# Patient Record
Sex: Female | Born: 1995 | Race: Black or African American | Hispanic: No | Marital: Single | Smoking: Never smoker
Health system: Southern US, Community
[De-identification: ages and names within clinical notes are randomized; demographics above are authoritative.]

## PROBLEM LIST (undated history)

## (undated) DIAGNOSIS — J45990 Exercise induced bronchospasm: Secondary | ICD-10-CM

## (undated) HISTORY — DX: Exercise induced bronchospasm: J45.990

---
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
## 2019-10-06 ENCOUNTER — Emergency Department (HOSPITAL_COMMUNITY)
Admission: EM | Admit: 2019-10-06 | Discharge: 2019-10-06 | Disposition: A | Discharge status: Home / Self Care | Payer: Self-pay | Attending: Emergency Medicine | Admitting: Emergency Medicine

## 2019-10-06 ENCOUNTER — Other Ambulatory Visit: Payer: Self-pay

## 2019-10-06 ENCOUNTER — Emergency Department (HOSPITAL_COMMUNITY): Payer: Self-pay

## 2019-10-06 ENCOUNTER — Encounter (HOSPITAL_COMMUNITY): Payer: Self-pay

## 2019-10-06 DIAGNOSIS — O99342 Other mental disorders complicating pregnancy, second trimester: Secondary | ICD-10-CM | POA: Insufficient documentation

## 2019-10-06 DIAGNOSIS — O99322 Drug use complicating pregnancy, second trimester: Secondary | ICD-10-CM | POA: Insufficient documentation

## 2019-10-06 DIAGNOSIS — Z3A00 Weeks of gestation of pregnancy not specified: Secondary | ICD-10-CM | POA: Insufficient documentation

## 2019-10-06 DIAGNOSIS — F1910 Other psychoactive substance abuse, uncomplicated: Secondary | ICD-10-CM | POA: Insufficient documentation

## 2019-10-06 DIAGNOSIS — F458 Other somatoform disorders: Secondary | ICD-10-CM | POA: Insufficient documentation

## 2019-10-06 LAB — CBC WITH DIFFERENTIAL/PLATELET
Abs Immature Granulocytes: 0.01 10*3/uL (ref 0.00–0.07)
Basophils Absolute: 0 10*3/uL (ref 0.0–0.1)
Basophils Relative: 1 %
Eosinophils Absolute: 0.1 10*3/uL (ref 0.0–0.5)
Eosinophils Relative: 2 %
HCT: 40.8 % (ref 36.0–46.0)
Hemoglobin: 13 g/dL (ref 12.0–15.0)
Immature Granulocytes: 0 %
Lymphocytes Relative: 35 %
Lymphs Abs: 2.5 10*3/uL (ref 0.7–4.0)
MCH: 26.3 pg (ref 26.0–34.0)
MCHC: 31.9 g/dL (ref 30.0–36.0)
MCV: 82.6 fL (ref 80.0–100.0)
Monocytes Absolute: 0.7 10*3/uL (ref 0.1–1.0)
Monocytes Relative: 9 %
Neutro Abs: 3.7 10*3/uL (ref 1.7–7.7)
Neutrophils Relative %: 53 %
Platelets: 243 10*3/uL (ref 150–400)
RBC: 4.94 MIL/uL (ref 3.87–5.11)
RDW: 14.7 % (ref 11.5–15.5)
WBC: 7 10*3/uL (ref 4.0–10.5)
nRBC: 0 % (ref 0.0–0.2)

## 2019-10-06 LAB — ACETAMINOPHEN LEVEL: Acetaminophen (Tylenol), Serum: <10 ug/mL — ABNORMAL LOW (ref 10–30)

## 2019-10-06 LAB — WET PREP, GENITAL
Sperm: NONE SEEN
Trich, Wet Prep: NONE SEEN
Yeast Wet Prep HPF POC: NONE SEEN

## 2019-10-06 LAB — BASIC METABOLIC PANEL
Anion gap: 9 (ref 5–15)
BUN: 14 mg/dL (ref 6–20)
CO2: 23 mmol/L (ref 22–32)
Calcium: 9.3 mg/dL (ref 8.9–10.3)
Chloride: 106 mmol/L (ref 98–111)
Creatinine, Ser: 0.74 mg/dL (ref 0.44–1.00)
GFR calc Af Amer: >60 mL/min (ref >60)
GFR calc non Af Amer: >60 mL/min (ref >60)
Glucose, Bld: 105 mg/dL — ABNORMAL HIGH (ref 70–99)
Potassium: 3.7 mmol/L (ref 3.5–5.1)
Sodium: 138 mmol/L (ref 135–145)

## 2019-10-06 LAB — URINALYSIS, ROUTINE W REFLEX MICROSCOPIC
Bilirubin Urine: NEGATIVE
Glucose, UA: NEGATIVE mg/dL
Ketones, ur: 20 mg/dL — AB
Nitrite: NEGATIVE
Protein, ur: 100 mg/dL — AB
Specific Gravity, Urine: 1.03 (ref 1.005–1.030)
pH: 5 (ref 5.0–8.0)

## 2019-10-06 LAB — HCG, QUANTITATIVE, PREGNANCY: hCG, Beta Chain, Quant, S: <1 m[IU]/mL (ref <5)

## 2019-10-06 LAB — SALICYLATE LEVEL: Salicylate Lvl: <7 mg/dL (ref 2.8–30.0)

## 2019-10-06 LAB — RAPID URINE DRUG SCREEN, HOSP PERFORMED
Amphetamines: POSITIVE — AB
Barbiturates: NOT DETECTED
Benzodiazepines: NOT DETECTED
Cocaine: POSITIVE — AB
Opiates: NOT DETECTED
Tetrahydrocannabinol: POSITIVE — AB

## 2019-10-06 LAB — PREGNANCY, URINE: Preg Test, Ur: NEGATIVE

## 2019-10-06 LAB — ETHANOL: Alcohol, Ethyl (B): <10 mg/dL (ref <10)

## 2019-10-06 LAB — TSH: TSH: 0.688 u[IU]/mL (ref 0.350–4.500)

## 2019-10-06 LAB — AMMONIA: Ammonia: 29 umol/L (ref 9–35)

## 2019-10-06 LAB — ABO/RH: ABO/RH(D): O POS

## 2019-10-06 MED ORDER — METRONIDAZOLE 500 MG PO TABS
500.0000 mg | ORAL_TABLET | Freq: Once | ORAL | Status: AC
  Administered 2019-10-06: 14:00:00 500 mg via ORAL
  Filled 2019-10-06: qty 1

## 2019-10-06 NOTE — BH Assessment (Signed)
Assessment Note  Megan Monroe is an 23 y.o. female presenting voluntarily to [facility] ED complaining of abdominal pain. She also states she is 5 months pregnant. Patient is guarded upon TTS assessment. She tells assessor she came to the ED for an ultra sound. She just returned from ultra sound at time of assessment. Patient is guarded and provides limited history. She states this is not her first pregnancy but will not discuss whether or not she has children. Patient is also vague about current living situation. She states she is not homeless but will tell who she lives with or where. Patient was receptive to receiving information on housing. Patient denies SI/HI/AVH or any prior psychiatric treatment. She denies any substance use. Counselor informed her that her UDS is positive for THC, amphetamines, and cocaine. She remained quiet and did not provide explanation. Patient's pregnancy test was negative, however patient went for ultrasound. Patient asked counselor what was in her ultra-sound. Explained that I had not reviewed ultrasound but that would be an appropriate question for her RN or EDP. Patient states she does not need any assistance.  Patient is alert and and oriented x 4. She is dressed in a hospital gown, laying in bed. Her speech is logical, eye contact is fair, and her thoughts are organized. Her mood is guarded and her affect is congruent. Her insight, judgment and impulse control seem impaired. Patient does not appear to be responding to internal stimuli. It is unclear if pregnancy is a substance induce delusion or organic in nature.   Diagnosis: Amphetamine induced psychosis   R/o cocaine, amphetamine, and cannabis use disorder   R/o delusional disorder  Past Medical History:  Past Medical History:  Diagnosis Date  . Exercise-induced asthma     History reviewed. No pertinent surgical history.  Family History:  Family History  Problem Relation Age of Onset  . Hypertension Father    . Diabetes Maternal Grandmother   . Heart attack Cousin        Early 30s    Social History:  reports that she has never smoked. She has never used smokeless tobacco. She reports that she does not drink alcohol or use drugs.  Additional Social History:  Alcohol / Drug Use Pain Medications: see MAR Prescriptions: see MAR Over the Counter: see MAR Longest period of sobriety (when/how long): patient denies substance use despite positive UDS  CIWA: CIWA-Ar BP: 126/86 Pulse Rate: (!) 105 COWS:    Allergies: No Known Allergies  Home Medications: (Not in a hospital admission)   OB/GYN Status:  Patient's last menstrual period was 05/06/2019.  General Assessment Data Location of Assessment: [facility] ED TTS Assessment: In system Is this a Tele or Face-to-Face Assessment?: Face-to-Face Is this an Initial Assessment or a Re-assessment for this encounter?: Initial Assessment Patient Accompanied by:: N/A Language Other than English: No Living Arrangements: Homeless/Shelter(UTA) What gender do you identify as?: Female Marital status: Single Maiden name: Humann Pregnancy Status: No Living Arrangements: Alone Can pt return to current living arrangement?: Yes Admission Status: Voluntary Is patient capable of signing voluntary admission?: Yes Referral Source: Self/Family/Friend Insurance type: None     Crisis Care Plan Living Arrangements: Alone Legal Guardian: (self) Name of Psychiatrist: none Name of Therapist: none  Education Status Is patient currently in school?: No Is the patient employed, unemployed or receiving disability?: Unemployed  Risk to self with the past 6 months Suicidal Ideation: No Has patient been a risk to self within the past 6 months prior to  admission? : No Suicidal Intent: No Has patient had any suicidal intent within the past 6 months prior to admission? : No Is patient at risk for suicide?: No Suicidal Plan?: No Has patient had any suicidal plan  within the past 6 months prior to admission? : No Access to Means: No What has been your use of drugs/alcohol within the last 12 months?: THC, amphetamines, cocaine Previous Attempts/Gestures: No How many times?: 0 Other Self Harm Risks: none Triggers for Past Attempts: None known Intentional Self Injurious Behavior: None Family Suicide History: No Recent stressful life event(s): (UTA) Persecutory voices/beliefs?: No Depression: No Depression Symptoms: (none) Substance abuse history and/or treatment for substance abuse?: No Suicide prevention information given to non-admitted patients: Not applicable  Risk to Others within the past 6 months Homicidal Ideation: No Does patient have any lifetime risk of violence toward others beyond the six months prior to admission? : No Thoughts of Harm to Others: No Current Homicidal Intent: No Current Homicidal Plan: No Access to Homicidal Means: No Identified Victim: none History of harm to others?: No Assessment of Violence: None Noted Violent Behavior Description: none Does patient have access to weapons?: No Criminal Charges Pending?: No Does patient have a court date: No Is patient on probation?: No  Psychosis Hallucinations: None noted Delusions: None noted  Mental Status Report Appearance/Hygiene: Unremarkable Eye Contact: Fair Motor Activity: Freedom of movement Speech: Logical/coherent Level of Consciousness: Alert Mood: Apathetic Affect: Appropriate to circumstance Anxiety Level: None Thought Processes: Coherent, Relevant Judgement: Impaired Orientation: Person, Place, Time, Situation Obsessive Compulsive Thoughts/Behaviors: None  Cognitive Functioning Concentration: Normal Memory: Unable to Assess Is patient IDD: No Insight: Poor Impulse Control: Poor Appetite: Good Have you had any weight changes? : No Change Sleep: No Change Total Hours of Sleep: 8 Vegetative Symptoms: None  ADLScreening [facility] Assessment  Services) Patient's cognitive ability adequate to safely complete daily activities?: Yes Patient able to express need for assistance with ADLs?: Yes Independently performs ADLs?: Yes (appropriate for developmental age)  Prior Inpatient Therapy Prior Inpatient Therapy: No  Prior Outpatient Therapy Prior Outpatient Therapy: No Does patient have an ACCT team?: No Does patient have Intensive In-House Services?  : No Does patient have [facility] services? : No Does patient have P4CC services?: No  ADL Screening (condition at time of admission) Patient's cognitive ability adequate to safely complete daily activities?: Yes Is the patient deaf or have difficulty hearing?: No Does the patient have difficulty seeing, even when wearing glasses/contacts?: No Does the patient have difficulty concentrating, remembering, or making decisions?: No Patient able to express need for assistance with ADLs?: Yes Does the patient have difficulty dressing or bathing?: No Independently performs ADLs?: Yes (appropriate for developmental age) Does the patient have difficulty walking or climbing stairs?: No Weakness of Legs: None Weakness of Arms/Hands: None  Home Assistive Devices/Equipment Home Assistive Devices/Equipment: None  Therapy Consults (therapy consults require a physician order) PT Evaluation Needed: No OT Evalulation Needed: No SLP Evaluation Needed: No Abuse/Neglect Assessment (Assessment to be complete while patient is alone) Abuse/Neglect Assessment Can Be Completed: Yes Physical Abuse: Denies Verbal Abuse: Denies Sexual Abuse: Denies Exploitation of patient/patient's resources: Denies Self-Neglect: Denies Values / Beliefs Cultural Requests During Hospitalization: None Spiritual Requests During Hospitalization: None Consults Spiritual Care Consult Needed: No Transition of Care Team Consult Needed: No Advance Directives (For Healthcare) Does Patient Have a Medical Advance Directive?:  No Would patient like information on creating a medical advance directive?: No - Patient declined  Disposition: Per Sherron Flemingsashawn Dixon, PMHNP patient does not meet in patient criteria. Patient to be discharged with outpatient resources. Disposition Initial Assessment Completed for this Encounter: Yes  On Site Evaluation by:   Reviewed with Physician:    Celedonio MiyamotoMeredith  Razan Siler 10/06/2019 1:26 PM

## 2019-10-06 NOTE — ED Notes (Signed)
Pt having difficulty answering questions. Pt oriented however. Pt must be directed multiple times to answer questions.

## 2019-10-06 NOTE — ED Notes (Signed)
EDP at bedside  

## 2019-10-06 NOTE — ED Notes (Signed)
US tech at bedside

## 2019-10-06 NOTE — ED Notes (Signed)
Pt refused COVID swab. MD aware

## 2019-10-06 NOTE — ED Provider Notes (Signed)
[facility] Provider Note   CSN: [id number] Arrival date & time: 10/06/19  [id number]     History Chief Complaint  Patient presents with   Back Pain   5 months pregnant    Megan Monroe is a 23 y.o. female.  Level 5 caveat for possible psychiatric disorder.  Patient states she is here for ultrasound.  She believes she is about 5 months pregnant.  She states her last period was "5 months ago".  She has not seen a doctor about this pregnancy.  She has been told by multiple people that she should have an ultrasound.  States she developed some spotting today about 15 minutes prior to arrival and has had some abdominal cramping.  But no significant vaginal bleeding.  She denies any fevers, chills, nausea or vomiting.  Patient very difficult to gather history from and seems to be under the influence of some substance.  She requires questions to be asked multiple times before she can give a straight answer.  She believes this is her third pregnancy.  She states she does not have any children at home but denies ever having a miscarriage.  She denies any psychiatric history denies any alcohol or drug use.  She denies any pain with urination or hematuria.  No fever or vomiting.  No chest pain or shortness of breath.  The history is provided by the patient.  Back Pain Associated symptoms: abdominal pain   Associated symptoms: no chest pain, no dysuria, no fever, no headaches and no weakness        Past Medical History:  Diagnosis Date   Exercise-induced asthma     Patient Active Problem List   Diagnosis Date Noted   Posterior tibial tendinitis 08/20/2012   Reactive airway disease 10/15/2011    History reviewed. No pertinent surgical history.   OB History   No obstetric history on file.     Family History  Problem Relation Age of Onset   Hypertension Father    Diabetes Maternal Grandmother    Heart attack Cousin        Early 30s     Social History   Tobacco Use   Smoking status: Never Smoker   Smokeless tobacco: Never Used  Substance Use Topics   Alcohol use: No   Drug use: No    Home Medications Prior to Admission medications   Medication Sig Start Date End Date Taking? Authorizing Provider  albuterol (PROVENTIL) (2.5 MG/3ML) 0.083% nebulizer solution Take 3 mLs (2.5 mg total) by nebulization every 6 (six) hours as needed for wheezing or shortness of breath. Patient not taking: Reported on 10/06/2019 10/15/11 10/14/12  Edd Arbourrton, Jonathan, MD    Allergies    Patient has no known allergies.  Review of Systems   Review of Systems  Constitutional: Negative for activity change, appetite change and fever.  HENT: Negative for congestion and rhinorrhea.   Eyes: Negative for visual disturbance.  Respiratory: Negative for chest tightness.   Cardiovascular: Negative for chest pain.  Gastrointestinal: Positive for abdominal pain. Negative for nausea and vomiting.  Genitourinary: Positive for vaginal bleeding. Negative for dysuria and hematuria.  Musculoskeletal: Positive for back pain.  Skin: Negative for rash.  Neurological: Negative for dizziness, weakness and headaches.  Psychiatric/Behavioral: Negative for self-injury, sleep disturbance and suicidal ideas.   all other systems are negative except as noted in the HPI and PMH.    Physical Exam Updated Vital Signs BP 126/86 (BP Location: Left Arm)  Pulse (!) 105    Temp 98 F (36.7 C) (Oral)    Resp 16    Ht 5' (1.524 m)    Wt 56.7 kg    LMP 05/06/2019    SpO2 98%    BMI 24.41 kg/m   Physical Exam Vitals and nursing note reviewed.  Constitutional:      General: She is not in acute distress.    Appearance: She is well-developed.     Comments: Distracted, requires questions to be asked multiple times where she can give an answer.  She is however oriented to person, place and time.  She denies any suicidal or homicidal thoughts  HENT:     Head:  Normocephalic and atraumatic.     Mouth/Throat:     Pharynx: No oropharyngeal exudate.  Eyes:     Conjunctiva/sclera: Conjunctivae normal.     Pupils: Pupils are equal, round, and reactive to light.  Neck:     Comments: No meningismus. Cardiovascular:     Rate and Rhythm: Normal rate and regular rhythm.     Heart sounds: Normal heart sounds. No murmur.  Pulmonary:     Effort: Pulmonary effort is normal. No respiratory distress.     Breath sounds: Normal breath sounds.  Abdominal:     Palpations: Abdomen is soft.     Tenderness: There is no abdominal tenderness. There is no guarding or rebound.  Genitourinary:    Comments: Copywriter, advertising present.  Normal external genitalia.  There is dark blood in the vaginal vault with a small amount of bleeding from her cervical os which is closed. There is no cervical motion tenderness or lateralizing adnexal pain Musculoskeletal:        General: No tenderness. Normal range of motion.     Cervical back: Normal range of motion and neck supple.  Skin:    General: Skin is warm.  Neurological:     Mental Status: She is alert and oriented to person, place, and time.     Cranial Nerves: No cranial nerve deficit.     Motor: No abnormal muscle tone.     Coordination: Coordination normal.     Comments: No ataxia on finger to nose bilaterally. No pronator drift. 5/5 strength throughout. CN 2-12 intact.Equal grip strength. Sensation intact.   Psychiatric:        Behavior: Behavior normal.     ED Results / Procedures / Treatments   Labs (all labs ordered are listed, but only abnormal results are displayed) Labs Reviewed  WET PREP, GENITAL - Abnormal; Notable for the following components:      Result Value   Clue Cells Wet Prep HPF POC PRESENT (*)    WBC, Wet Prep HPF POC FEW (*)    All other components within normal limits  URINALYSIS, ROUTINE W REFLEX MICROSCOPIC - Abnormal; Notable for the following components:   Color, Urine AMBER (*)     APPearance HAZY (*)    Hgb urine dipstick LARGE (*)    Ketones, ur 20 (*)    Protein, ur 100 (*)    Leukocytes,Ua TRACE (*)    Bacteria, UA FEW (*)    All other components within normal limits  RAPID URINE DRUG SCREEN, HOSP PERFORMED - Abnormal; Notable for the following components:   Cocaine POSITIVE (*)    Amphetamines POSITIVE (*)    Tetrahydrocannabinol POSITIVE (*)    All other components within normal limits  BASIC METABOLIC PANEL - Abnormal; Notable for the following components:  Glucose, Bld 105 (*)    All other components within normal limits  ACETAMINOPHEN LEVEL - Abnormal; Notable for the following components:   Acetaminophen (Tylenol), Serum <10 (*)    All other components within normal limits  SARS CORONAVIRUS 2 (TAT 6-24 HRS)  CBC WITH DIFFERENTIAL/PLATELET  HCG, QUANTITATIVE, PREGNANCY  PREGNANCY, URINE  ETHANOL  SALICYLATE LEVEL  AMMONIA  TSH  ABO/RH  GC/CHLAMYDIA PROBE AMP ([place]) NOT AT [facility]    EKG None  Radiology US Pelvis Complete  Result Date: 10/06/2019 CLINICAL DATA:  Vaginal bleeding EXAM: TRANSABDOMINAL ULTRASOUND OF PELVIS DOPPLER ULTRASOUND OF OVARIES TECHNIQUE: Transabdominal ultrasound examination of the pelvis was performed including evaluation of the uterus, ovaries, adnexal regions, and pelvic cul-de-sac. Color and duplex Doppler ultrasound was utilized to evaluate blood flow to the ovaries. Endovaginal evaluation was not performed due to patient wishes. COMPARISON:  None. FINDINGS: Uterus Measurements: 6.8 x 3.6 x 4.4 cm = volume: 55 mL. Limited assessment without focal abnormality. Endovaginal exam not performed. Endometrium Thickness: 3 mm.  Limited assessment without focal abnormality Right ovary Measurements: 3.1 x 1.9 x 2.8 cm = volume: 8.3 mL. Normal appearance/no adnexal mass. Left ovary Measurements: 2.9 x 1.4 x 1.8 cm = volume: 3.7 mL. Normal appearance/no adnexal mass. Pulsed Doppler evaluation demonstrates normal low-resistance  arterial and venous waveforms in both ovaries. Other: Urinary bladder is under distended limiting the exam. IMPRESSION: 1. No signs of ovarian torsion or free fluid. 2. Limited assessment due to lack of endovaginal examination at the patient's request. In Electronically Signed   By: Zetta Bills M.D.   On: 10/06/2019 12:59   [place] Art/Ven Flow Abd Pelv Doppler  Result Date: 10/06/2019 CLINICAL DATA:  Vaginal bleeding EXAM: TRANSABDOMINAL ULTRASOUND OF PELVIS DOPPLER ULTRASOUND OF OVARIES TECHNIQUE: Transabdominal ultrasound examination of the pelvis was performed including evaluation of the uterus, ovaries, adnexal regions, and pelvic cul-de-sac. Color and duplex Doppler ultrasound was utilized to evaluate blood flow to the ovaries. Endovaginal evaluation was not performed due to patient wishes. COMPARISON:  None. FINDINGS: Uterus Measurements: 6.8 x 3.6 x 4.4 cm = volume: 55 mL. Limited assessment without focal abnormality. Endovaginal exam not performed. Endometrium Thickness: 3 mm.  Limited assessment without focal abnormality Right ovary Measurements: 3.1 x 1.9 x 2.8 cm = volume: 8.3 mL. Normal appearance/no adnexal mass. Left ovary Measurements: 2.9 x 1.4 x 1.8 cm = volume: 3.7 mL. Normal appearance/no adnexal mass. Pulsed Doppler evaluation demonstrates normal low-resistance arterial and venous waveforms in both ovaries. Other: Urinary bladder is under distended limiting the exam. IMPRESSION: 1. No signs of ovarian torsion or free fluid. 2. Limited assessment due to lack of endovaginal examination at the patient's request. In Electronically Signed   By: Zetta Bills M.D.   On: 10/06/2019 12:59    Procedures Procedures (including critical care time)  Medications Ordered in ED Medications - No data to display  ED Course  I have reviewed the triage vital signs and the nursing notes.  Pertinent labs & imaging results that were available during my care of the patient were reviewed by me and  considered in my medical decision making (see chart for details).    MDM Rules/Calculators/A&P                       Patient here with possibility of pregnancy.  States last menstrual cycle was 5 months ago.  She appears to be under the influence of some substance.  Abdomen is soft.  Vitals are stable.  hCG is negative.  Patient informed of same.  TTS will be consulted given her delusion of pregnancy, bizarre behavior, concern for underlying psychiatric illness  Drug screen is positive for cocaine, THC and amphetamines.  Patient seen by TTS. They feel that she has amphetamine induced psychosis. She does not meet inpatient criteria.   Patient eloped from the ED before results could be discussed or outpatient resources given. She did no receive her head CT or treatment for her bacterial vaginosis.  Final Clinical Impression(s) / ED Diagnoses Final diagnoses:  Delusion of pregnancy  Polysubstance abuse [facility])    Rx / DC Orders ED Discharge Orders    None       Naturi Alarid, Jeannett Senior, MD 10/06/19 1805

## 2019-10-06 NOTE — ED Notes (Signed)
[place] tech requesting to speak with MD. Pt is uncooperative and non compliant with [place] tech directions for trans vaginal [place]. Pt is upset that she cannot see the screen as the [place] tech is trying to complete US. MD made aware. Per MD: Cancel transvaginal

## 2019-10-06 NOTE — ED Triage Notes (Signed)
Patient c/o bilateral low back pain. Patient told Probation officer that aher abdomen was hurting when she came into triage. Then when questioned about this the patient states, "My belly does not hurt. I just like rubbing it."  Patient states she is 5 months pregnant ,but has not seen an OB/GYN.

## 2019-10-06 NOTE — ED Notes (Signed)
Pt came out of room and stated " I am going to head out" Pt asked to wait to consult with MD. Pt went back in room. This RN went to speak to MD and pt eloped from room.

## 2019-10-06 NOTE — ED Notes (Signed)
Pt trying obtain urine for the second time. The first time pt states "the cup wouldn't let me do it"

## 2019-10-07 ENCOUNTER — Emergency Department (HOSPITAL_COMMUNITY)
Admission: EM | Admit: 2019-10-07 | Discharge: 2019-10-07 | Disposition: A | Discharge status: Home / Self Care | Payer: Self-pay | Attending: Emergency Medicine | Admitting: Emergency Medicine

## 2019-10-07 ENCOUNTER — Other Ambulatory Visit: Payer: Self-pay

## 2019-10-07 ENCOUNTER — Encounter (HOSPITAL_COMMUNITY): Payer: Self-pay

## 2019-10-07 DIAGNOSIS — B9689 Other specified bacterial agents as the cause of diseases classified elsewhere: Secondary | ICD-10-CM | POA: Insufficient documentation

## 2019-10-07 DIAGNOSIS — N760 Acute vaginitis: Secondary | ICD-10-CM | POA: Insufficient documentation

## 2019-10-07 DIAGNOSIS — J45909 Unspecified asthma, uncomplicated: Secondary | ICD-10-CM | POA: Insufficient documentation

## 2019-10-07 DIAGNOSIS — F22 Delusional disorders: Secondary | ICD-10-CM | POA: Insufficient documentation

## 2019-10-07 DIAGNOSIS — F1910 Other psychoactive substance abuse, uncomplicated: Secondary | ICD-10-CM | POA: Insufficient documentation

## 2019-10-07 LAB — GC/CHLAMYDIA PROBE AMP (~~LOC~~) NOT AT ARMC
Chlamydia: NEGATIVE
Neisseria Gonorrhea: NEGATIVE

## 2019-10-07 MED ORDER — METRONIDAZOLE 500 MG PO TABS: 500.0000 mg | ORAL_TABLET | Freq: Two times a day (BID) | ORAL | 0 refills | Status: DC

## 2019-10-07 NOTE — ED Triage Notes (Signed)
Pt reports that she would like to check on her pregnancy. Pt was dx'd with delusion of pregnancy yesterday. Pt also reporting that Tupac Shakur is a patient here and she needs to find him. Denies SI/HI.

## 2019-10-07 NOTE — ED Provider Notes (Signed)
[facility]-EMERGENCY DEPT Provider Note: Megan DellJ. Lane Savior Himebaugh, MD, FACEP  CSN: [id number] MRN: [id number] ARRIVAL: 10/07/19 at 0223 ROOM: WA16/WA16   CHIEF COMPLAINT  Delusional  Level 5 caveat: Delusional HISTORY OF PRESENT ILLNESS  10/07/19 5:32 AM Megan Monroe is a 23 y.o. female who was seen here yesterday believing she was 5 months pregnant.  Pregnancy test and quantitative beta-hCG were negative for pregnancy.  She did have a pelvic exam which showed evidence of bacterial vaginosis.  She also tested positive for multiple drugs of abuse.  She denied having a psychiatric history.  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  She believes she is pregnant when she is not.  She also believes she is 6 foot tall which she has not.  She also believes that [employer] Shakur is a patient in this hospital as she needs to find him.  She is otherwise calm and collected.  She denies any homicidal ideation or suicidal ideation.  She denies any acute physical complaint.  Triage informs me that she only checked in this morning because she wanted to be with her friend, a homeless man who checked in for ankle pain.  We currently have a policy, due to Covid, prohibiting visitors from coming back with patients.   The patient eloped yesterday before getting treatment for BV.   Past Medical History:  Diagnosis Date  . Exercise-induced asthma     History reviewed. No pertinent surgical history.  Family History  Problem Relation Age of Onset  . Hypertension Father   . Diabetes Maternal Grandmother   . Heart attack Cousin        Early 30s    Social History   Tobacco Use  . Smoking status: Never Smoker  . Smokeless tobacco: Never Used  Substance Use Topics  . Alcohol use: No  . Drug use: No    Prior to Admission medications   Medication Sig Start Date End Date Taking? Authorizing Provider  metroNIDAZOLE (FLAGYL) 500 MG tablet Take 1 tablet (500 mg total) by  mouth 2 (two) times daily. One po bid x 7 days 10/07/19   Janelys Glassner, MD  albuterol (PROVENTIL) (2.5 MG/3ML) 0.083% nebulizer solution Take 3 mLs (2.5 mg total) by nebulization every 6 (six) hours as needed for wheezing or shortness of breath. Patient not taking: Reported on 10/06/2019 10/15/11 10/07/19  Edd Arbourrton, Jonathan, MD    Allergies Patient has no known allergies.   REVIEW OF SYSTEMS  Negative except as noted here or in the History of Present Illness.   PHYSICAL EXAMINATION  Initial Vital Signs Blood pressure 119/72, pulse 77, temperature 97.7 F (36.5 C), temperature source Oral, resp. rate 18, height 5\' 5"  (1.651 m), weight 56.7 kg, SpO2 99 %.  Examination General: Well-developed, well-nourished female in no acute distress; appearance consistent with age of record HENT: normocephalic; atraumatic Eyes: pupils equal, round and reactive to light; extraocular muscles intact Neck: supple Heart: regular rate and rhythm Lungs: clear to auscultation bilaterally Abdomen: soft; nondistended; nontender; bowel sounds present Extremities: No deformity; full range of motion Neurologic: Awake, alert; motor function intact in all extremities and symmetric; no facial droop Skin: Warm and dry Psychiatric: Delusions; no HI; no SI; affable and cooperative   RESULTS  Summary of this visit's results, reviewed and interpreted by myself:   EKG Interpretation  Date/Time:    Ventricular Rate:    PR Interval:    QRS Duration:   QT Interval:  QTC Calculation:   R Axis:     Text Interpretation:        Laboratory Studies: Results for orders placed or performed during the hospital encounter of 10/06/19 (from the past 24 hour(s))  Urinalysis, Routine w reflex microscopic     Status: Abnormal   Collection Time: 10/06/19  8:41 AM  Result Value Ref Range   Color, Urine AMBER (A) YELLOW   APPearance HAZY (A) CLEAR   Specific Gravity, Urine 1.030 1.005 - 1.030   pH 5.0 5.0 - 8.0    Glucose, UA NEGATIVE NEGATIVE mg/dL   Hgb urine dipstick LARGE (A) NEGATIVE   Bilirubin Urine NEGATIVE NEGATIVE   Ketones, ur 20 (A) NEGATIVE mg/dL   Protein, ur 161 (A) NEGATIVE mg/dL   Nitrite NEGATIVE NEGATIVE   Leukocytes,Ua TRACE (A) NEGATIVE   RBC / HPF 21-50 0 - 5 RBC/hpf   WBC, UA 6-10 0 - 5 WBC/hpf   Bacteria, UA FEW (A) NONE SEEN   Squamous Epithelial / LPF 11-20 0 - 5   Mucus PRESENT   Urine rapid drug screen (hosp performed)not at [facility]     Status: Abnormal   Collection Time: 10/06/19  8:41 AM  Result Value Ref Range   Opiates NONE DETECTED NONE DETECTED   Cocaine POSITIVE (A) NONE DETECTED   Benzodiazepines NONE DETECTED NONE DETECTED   Amphetamines POSITIVE (A) NONE DETECTED   Tetrahydrocannabinol POSITIVE (A) NONE DETECTED   Barbiturates NONE DETECTED NONE DETECTED  CBC with Differential     Status: None   Collection Time: 10/06/19  8:41 AM  Result Value Ref Range   WBC 7.0 4.0 - 10.5 K/uL   RBC 4.94 3.87 - 5.11 MIL/uL   Hemoglobin 13.0 12.0 - 15.0 g/dL   HCT 09.6 04.5 - 40.9 %   MCV 82.6 80.0 - 100.0 fL   MCH 26.3 26.0 - 34.0 pg   MCHC 31.9 30.0 - 36.0 g/dL   RDW 81.1 91.4 - 78.2 %   Platelets 243 150 - 400 K/uL   nRBC 0.0 0.0 - 0.2 %   Neutrophils Relative % 53 %   Neutro Abs 3.7 1.7 - 7.7 K/uL   Lymphocytes Relative 35 %   Lymphs Abs 2.5 0.7 - 4.0 K/uL   Monocytes Relative 9 %   Monocytes Absolute 0.7 0.1 - 1.0 K/uL   Eosinophils Relative 2 %   Eosinophils Absolute 0.1 0.0 - 0.5 K/uL   Basophils Relative 1 %   Basophils Absolute 0.0 0.0 - 0.1 K/uL   Immature Granulocytes 0 %   Abs Immature Granulocytes 0.01 0.00 - 0.07 K/uL  Basic metabolic panel     Status: Abnormal   Collection Time: 10/06/19  8:41 AM  Result Value Ref Range   Sodium 138 135 - 145 mmol/L   Potassium 3.7 3.5 - 5.1 mmol/L   Chloride 106 98 - 111 mmol/L   CO2 23 22 - 32 mmol/L   Glucose, Bld 105 (H) 70 - 99 mg/dL   BUN 14 6 - 20 mg/dL   Creatinine, Ser 9.56 0.44 - 1.00 mg/dL    Calcium 9.3 8.9 - 21.3 mg/dL   GFR calc non Af Amer >60 >60 mL/min   GFR calc Af Amer >60 >60 mL/min   Anion gap 9 5 - 15  ABO/Rh     Status: None   Collection Time: 10/06/19  8:41 AM  Result Value Ref Range   ABO/RH(D)      O POS Performed at [facility] [employer]  [place] [address], [place], [place] [address]   hCG, quantitative, pregnancy     Status: None   Collection Time: 10/06/19  8:41 AM  Result Value Ref Range   hCG, Beta Chain, Quant, S <1 <5 mIU/mL  Pregnancy, urine     Status: None   Collection Time: 10/06/19  8:41 AM  Result Value Ref Range   Preg Test, Ur NEGATIVE NEGATIVE  Ethanol     Status: None   Collection Time: 10/06/19  9:03 AM  Result Value Ref Range   Alcohol, Ethyl (B) <19 <14 mg/dL  Salicylate Level     Status: None   Collection Time: 10/06/19  9:03 AM  Result Value Ref Range   Salicylate Lvl <7.8 2.8 - 30.0 mg/dL  Acetaminophen Level     Status: Abnormal   Collection Time: 10/06/19  9:03 AM  Result Value Ref Range   Acetaminophen (Tylenol), Serum <10 (L) 10 - 30 ug/mL  Ammonia     Status: None   Collection Time: 10/06/19  9:03 AM  Result Value Ref Range   Ammonia 29 9 - 35 umol/L  TSH     Status: None   Collection Time: 10/06/19  9:03 AM  Result Value Ref Range   TSH 0.688 0.350 - 4.500 uIU/mL  Wet prep, genital     Status: Abnormal   Collection Time: 10/06/19 10:04 AM   Specimen: Cervix  Result Value Ref Range   Yeast Wet Prep HPF POC NONE SEEN NONE SEEN   Trich, Wet Prep NONE SEEN NONE SEEN   Clue Cells Wet Prep HPF POC PRESENT (A) NONE SEEN   WBC, Wet Prep HPF POC FEW (A) NONE SEEN   Sperm NONE SEEN    Imaging Studies: US Pelvis Complete  Result Date: 10/06/2019 CLINICAL DATA:  Vaginal bleeding EXAM: TRANSABDOMINAL ULTRASOUND OF PELVIS DOPPLER ULTRASOUND OF OVARIES TECHNIQUE: Transabdominal ultrasound examination of the pelvis was performed including evaluation of the uterus, ovaries, adnexal regions, and pelvic cul-de-sac. Color  and duplex Doppler ultrasound was utilized to evaluate blood flow to the ovaries. Endovaginal evaluation was not performed due to patient wishes. COMPARISON:  None. FINDINGS: Uterus Measurements: 6.8 x 3.6 x 4.4 cm = volume: 55 mL. Limited assessment without focal abnormality. Endovaginal exam not performed. Endometrium Thickness: 3 mm.  Limited assessment without focal abnormality Right ovary Measurements: 3.1 x 1.9 x 2.8 cm = volume: 8.3 mL. Normal appearance/no adnexal mass. Left ovary Measurements: 2.9 x 1.4 x 1.8 cm = volume: 3.7 mL. Normal appearance/no adnexal mass. Pulsed Doppler evaluation demonstrates normal low-resistance arterial and venous waveforms in both ovaries. Other: Urinary bladder is under distended limiting the exam. IMPRESSION: 1. No signs of ovarian torsion or free fluid. 2. Limited assessment due to lack of endovaginal examination at the patient's request. In Electronically Signed   By: Zetta Bills M.D.   On: 10/06/2019 12:59   [place] Art/Ven Flow Abd Pelv Doppler  Result Date: 10/06/2019 CLINICAL DATA:  Vaginal bleeding EXAM: TRANSABDOMINAL ULTRASOUND OF PELVIS DOPPLER ULTRASOUND OF OVARIES TECHNIQUE: Transabdominal ultrasound examination of the pelvis was performed including evaluation of the uterus, ovaries, adnexal regions, and pelvic cul-de-sac. Color and duplex Doppler ultrasound was utilized to evaluate blood flow to the ovaries. Endovaginal evaluation was not performed due to patient wishes. COMPARISON:  None. FINDINGS: Uterus Measurements: 6.8 x 3.6 x 4.4 cm = volume: 55 mL. Limited assessment without focal abnormality. Endovaginal exam not performed. Endometrium Thickness: 3 mm.  Limited assessment without focal  abnormality Right ovary Measurements: 3.1 x 1.9 x 2.8 cm = volume: 8.3 mL. Normal appearance/no adnexal mass. Left ovary Measurements: 2.9 x 1.4 x 1.8 cm = volume: 3.7 mL. Normal appearance/no adnexal mass. Pulsed Doppler evaluation demonstrates normal low-resistance  arterial and venous waveforms in both ovaries. Other: Urinary bladder is under distended limiting the exam. IMPRESSION: 1. No signs of ovarian torsion or free fluid. 2. Limited assessment due to lack of endovaginal examination at the patient's request. In Electronically Signed   By: Donzetta Kohut M.D.   On: 10/06/2019 12:59    ED COURSE and MDM  Nursing notes, initial and subsequent vitals signs, including pulse oximetry, reviewed and interpreted by myself.  Vitals:   10/07/19 0230  BP: 119/72  Pulse: 77  Resp: 18  Temp: 97.7 F (36.5 C)  TempSrc: Oral  SpO2: 99%  Weight: 56.7 kg  Height:  (1.651 m)   Medications - No data to display  There is no indication of acute physical illness or psychiatric illness requiring further work-up or inpatient treatment.  PROCEDURES  Procedures   ED DIAGNOSES     ICD-10-CM   1. Delusional disorder (HCC)  F22   2. Bacterial vaginosis  N76.0    B96.89   3. Polysubstance abuse (HCC)  F19.10        Janeice Stegall, Jonny Ruiz, MD 10/07/19 [phone]

## 2019-10-27 ENCOUNTER — Encounter (HOSPITAL_COMMUNITY): Payer: Self-pay

## 2019-10-27 ENCOUNTER — Emergency Department (HOSPITAL_COMMUNITY)
Admission: EM | Admit: 2019-10-27 | Discharge: 2019-10-29 | Disposition: A | Discharge status: Other Acute Inpatient Hospital | Payer: Self-pay | Attending: Emergency Medicine | Admitting: Emergency Medicine

## 2019-10-27 ENCOUNTER — Other Ambulatory Visit: Payer: Self-pay

## 2019-10-27 DIAGNOSIS — J45909 Unspecified asthma, uncomplicated: Secondary | ICD-10-CM | POA: Insufficient documentation

## 2019-10-27 DIAGNOSIS — Z20822 Contact with and (suspected) exposure to covid-19: Secondary | ICD-10-CM | POA: Insufficient documentation

## 2019-10-27 DIAGNOSIS — F29 Unspecified psychosis not due to a substance or known physiological condition: Secondary | ICD-10-CM

## 2019-10-27 DIAGNOSIS — R4182 Altered mental status, unspecified: Secondary | ICD-10-CM | POA: Insufficient documentation

## 2019-10-27 LAB — COMPREHENSIVE METABOLIC PANEL
ALT: 18 U/L (ref 0–44)
AST: 23 U/L (ref 15–41)
Albumin: 4.1 g/dL (ref 3.5–5.0)
Alkaline Phosphatase: 60 U/L (ref 38–126)
Anion gap: 10 (ref 5–15)
BUN: 14 mg/dL (ref 6–20)
CO2: 24 mmol/L (ref 22–32)
Calcium: 9.2 mg/dL (ref 8.9–10.3)
Chloride: 104 mmol/L (ref 98–111)
Creatinine, Ser: 0.78 mg/dL (ref 0.44–1.00)
GFR calc Af Amer: >60 mL/min (ref >60)
GFR calc non Af Amer: >60 mL/min (ref >60)
Glucose, Bld: 92 mg/dL (ref 70–99)
Potassium: 3.5 mmol/L (ref 3.5–5.1)
Sodium: 138 mmol/L (ref 135–145)
Total Bilirubin: 0.7 mg/dL (ref 0.3–1.2)
Total Protein: 7.2 g/dL (ref 6.5–8.1)

## 2019-10-27 LAB — SALICYLATE LEVEL: Salicylate Lvl: <7 mg/dL — ABNORMAL LOW (ref 7.0–30.0)

## 2019-10-27 LAB — RAPID URINE DRUG SCREEN, HOSP PERFORMED
Amphetamines: NOT DETECTED
Barbiturates: NOT DETECTED
Benzodiazepines: NOT DETECTED
Cocaine: NOT DETECTED
Opiates: NOT DETECTED
Tetrahydrocannabinol: POSITIVE — AB

## 2019-10-27 LAB — CBC
HCT: 41 % (ref 36.0–46.0)
Hemoglobin: 13.2 g/dL (ref 12.0–15.0)
MCH: 26.8 pg (ref 26.0–34.0)
MCHC: 32.2 g/dL (ref 30.0–36.0)
MCV: 83.3 fL (ref 80.0–100.0)
Platelets: 268 10*3/uL (ref 150–400)
RBC: 4.92 MIL/uL (ref 3.87–5.11)
RDW: 15.5 % (ref 11.5–15.5)
WBC: 7.5 10*3/uL (ref 4.0–10.5)
nRBC: 0 % (ref 0.0–0.2)

## 2019-10-27 LAB — ETHANOL: Alcohol, Ethyl (B): <10 mg/dL (ref <10)

## 2019-10-27 LAB — I-STAT BETA HCG BLOOD, ED (MC, WL, AP ONLY): I-stat hCG, quantitative: <5 m[IU]/mL (ref <5)

## 2019-10-27 LAB — POC SARS CORONAVIRUS 2 AG -  ED: SARS Coronavirus 2 Ag: NEGATIVE

## 2019-10-27 LAB — ACETAMINOPHEN LEVEL: Acetaminophen (Tylenol), Serum: <10 ug/mL — ABNORMAL LOW (ref 10–30)

## 2019-10-27 NOTE — ED Notes (Signed)
Pt politely informed that we need urine.  She took the urine cup and went to the bath room. Also, a "Nun's cap" was put on the toilet.

## 2019-10-27 NOTE — Progress Notes (Signed)
   10/27/19 0724  General Assessment Data  Reason for not completing assessment [facility] attempted to complete the [facility] Assessment.  Pt would not verify her name.  Pt stated "my name is Megan Monroe"  When  [facility] asked pt to verify her name again, pt refused.  TTS will attempt to assess pt at a later time.      Nikol Lemar L. Nye, [place], [facility], [facility] Therapeutic Triage Specialist  [phone]

## 2019-10-27 NOTE — ED Triage Notes (Signed)
On admission from triage to room 32, she states she doesn't know why she is here. Flippant and not invested in writers questions or answering them. Offered her food and she followed Probation officer into the snack room. When asked her to leave that I would bring it to her she left without further discussion. She used pleasant and appropriate manners with Probation officer once she was given food and drink but still wouldn't provide any information. Please see IVC paperwork.
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
## 2019-10-27 NOTE — ED Notes (Signed)
Opens eyes when spoken to but does not respond verbally and closes eyes again.  In bed resting.

## 2019-10-27 NOTE — BH Assessment (Addendum)
Tele Assessment Note   Patient Name: Megan Monroe MRN: [id number] Referring Physician: Merrily Pew, MD Location of Patient: [facility] Location of Provider: [place]  Nathan Stallworth is an 23 y.o. female. Pt presents to [facility] under IVC. Pt presented very polite but guarded during assessment. Pt responded to every single question, "Ummm, no" Pt was asked what brought her to the ED and she responded, "They told me I could leave". Pt denied SI, HI, AVH, SIB and substance use. Pt current UDS positive for marijuana. Pt reported having no provider and not taking any medications. Pt denied having any family support or collateral contacts.  When asked where she lives pt states, " nowhere close". Pt stated she is currently unemployed. Pt states she wants a phone to call her a ride so she can leave ED. Pt states she does not need any resources.   IVC paperwork:  She presents with paranoia and delusions. The patient states that there are several women that follow her from city to city trying to take her. She states it is the same women where she goes. She reports having multiple children but doesn't know where they are. It has been reported she has no children, Patient having some bizarrebehavior by glancing around the room and has poor eye contact.   Respondent wanted her friend to kill a man. She catches rides with random people. Lack of proper hygiene. Sleeping on the side of building. Not taking her meds. Missing from her family. She mentions the kidnapping of her child and she does not have a child."     Patient is alert and and oriented x 4. She is dressed in regular clothing , laying in bed. Her speech is logical, eye contact is fair, and her thoughts are organized. Her mood is guarded and her affect is congruent. Her insight, judgment and impulse control seem impaired. Patient does not appear to be responding to internal stimuli.   Diagnosis: Altered Mental  Status  Past Medical History:  Past Medical History:  Diagnosis Date  . Exercise-induced asthma     History reviewed. No pertinent surgical history.  Family History:  Family History  Problem Relation Age of Onset  . Hypertension Father   . Diabetes Maternal Grandmother   . Heart attack Cousin        Early 43s    Social History:  reports that she has never smoked. She has never used smokeless tobacco. She reports that she does not drink alcohol or use drugs.  Additional Social History:  Alcohol / Drug Use Pain Medications: see MAR Prescriptions: see MAR Over the Counter: see MAR  CIWA: CIWA-Ar BP: 131/78 Pulse Rate: 78 COWS:    Allergies: No Known Allergies  Home Medications: (Not in a hospital admission)   OB/GYN Status:  No LMP recorded.  General Assessment Data Assessment unable to be completed: Yes Reason for not completing assessment: [place] attempted to complete the [facility] Assessment.  Pt would not verify her name.  Pt stated "my name is Megan Monroe"  When  [facility] asked pt verify her name again, pt refused.  TTS will attempt to assess pt at a later time.   Location of Assessment: [facility] TTS Assessment: In system Is this a Tele or Face-to-Face Assessment?: Tele Assessment Is this an Initial Assessment or a Re-assessment for this encounter?: Initial Assessment Patient Accompanied by:: N/A Language Other than English: No Living Arrangements: Homeless/Shelter What gender do you identify as?: Female Marital status: Single Admission Status:  Voluntary Is patient capable of signing voluntary admission?: Yes Referral Source: Self/Family/Friend Insurance type: none     Crisis Care Plan Name of Psychiatrist: none Name of Therapist: none  Education Status Is patient currently in school?: No Is the patient employed, unemployed or receiving disability?: Unemployed  Risk to self with the past 6 months Suicidal Ideation: No Has patient been a risk to self within the past 6  months prior to admission? : No Suicidal Intent: No Has patient had any suicidal intent within the past 6 months prior to admission? : No Is patient at risk for suicide?: No Suicidal Plan?: No Has patient had any suicidal plan within the past 6 months prior to admission? : No Access to Means: (UTA) What has been your use of drugs/alcohol within the last 12 months?: pt denied Previous Attempts/Gestures: (UTA) How many times?: (UTA) Other Self Harm Risks: (UTA) Triggers for Past Attempts: None known Intentional Self Injurious Behavior: None Family Suicide History: No Recent stressful life event(s): (UTA) Persecutory voices/beliefs?: Rich Reining) Depression: (UTA) Depression Symptoms: (UTA) Substance abuse history and/or treatment for substance abuse?: No Suicide prevention information given to non-admitted patients: Not applicable  Risk to Others within the past 6 months Homicidal Ideation: No Does patient have any lifetime risk of violence toward others beyond the six months prior to admission? : No Thoughts of Harm to Others: No Current Homicidal Intent: No Current Homicidal Plan: No Access to Homicidal Means: No History of harm to others?: No Assessment of Violence: None Noted Does patient have access to weapons?: (UTA) Criminal Charges Pending?: No Does patient have a court date: No Is patient on probation?: No  Psychosis Hallucinations: None noted Delusions: None noted  Mental Status Report Appearance/Hygiene: Unremarkable Eye Contact: Good Motor Activity: Freedom of movement Speech: Logical/coherent Level of Consciousness: Alert Mood: Apathetic Affect: Appropriate to circumstance Anxiety Level: None Thought Processes: Coherent Judgement: Impaired Orientation: Not oriented Obsessive Compulsive Thoughts/Behaviors: None  Cognitive Functioning Concentration: Normal Memory: Unable to Assess Is patient IDD: No Insight: Poor Impulse Control: Poor Appetite: Good Have  you had any weight changes? : No Change Sleep: No Change Total Hours of Sleep: 8 Vegetative Symptoms: None  ADLScreening [facility] Assessment Services) Patient's cognitive ability adequate to safely complete daily activities?: Yes Patient able to express need for assistance with ADLs?: Yes Independently performs ADLs?: Yes (appropriate for developmental age)  Prior Inpatient Therapy Prior Inpatient Therapy: No  Prior Outpatient Therapy Prior Outpatient Therapy: No Does patient have an ACCT team?: No Does patient have Intensive In-House Services?  : No Does patient have [facility] services? : No Does patient have P4CC services?: No  ADL Screening (condition at time of admission) Patient's cognitive ability adequate to safely complete daily activities?: Yes Patient able to express need for assistance with ADLs?: Yes Independently performs ADLs?: Yes (appropriate for developmental age)                        Disposition: Per Dr Jola Babinski, pt meets inpatient criteria. Per [facility] Heather , TTS to fax out resources for bed for pt. TTS confirmed status with attending provider. Disposition Initial Assessment Completed for this Encounter: Yes  This service was provided via telemedicine using a 2-way, interactive audio and video technology.  Names of all persons participating in this telemedicine service and their role in this encounter. Name: Megan Monroe Role: Patient  Name: Lacey Jensen Role: TTS Counselor  Name:  Role:   Name:  Role:  Claria DiceKiara M Drevion Offord 10/27/2019 1:50 PM

## 2019-10-27 NOTE — ED Notes (Signed)
In to take her am vitals. Asked how old she was, responded "im grown" asked her how old grown was and she said "21 something" Minimal with information and borderline cooperation. She used the bathroom across the hall from her room. Reminded to use bathroom in her room, and minutes later again crossed the hall to use the hall bathroom.

## 2019-10-27 NOTE — Consult Note (Signed)
Tele Assessment.   Megan Monroe, 23 y.o., female patient presented to [facility].  Patient seen via telepsych by this provider; chart reviewed and consulted with Dr. Mallie Darting on 10/27/19.  On evaluation Megan Monroe demonstrates difficulty in rendering a cohesive story regarding events leading to her current inpatient hospitalization. She offers tangential responses to questions and appears preoccupied by something else in the room (although no one else is physically present).  She is paranoid and delusional, thinks 2 women are following her.  She relates she has children but she is unsure of their whereabouts. The patient also reports her mother as deceased.    The patient's mood is dysphoric but she cooperates with assessment.  Based on her altered mental status, lack of insight and impaired judgement, mental health dx and previous inpatient hospitalizations,  she poses an moderate to high safety risk.  Therefore, she continues to meet criteria for inpatient admission to [facility] hall.    Disposition: Recommend psychiatric Inpatient admission when medically cleared.   This service was provided via telemedicine using a 2-way, interactive audio and video technology.   Names of all persons participating in this telemedicine service and their role in this encounter. Name: Megan Monroe Role: Patient  Name: Merlyn Lot Role: [place]  Name: Darnelle Maffucci Money Role: PMHNP

## 2019-10-27 NOTE — ED Triage Notes (Addendum)
Pt IVC'd by her cousin. Papers state, " Respondent wanted her friend to kill a man. She catches rides with random people. Lack of proper hygiene. Sleeping on the side of building. Not taking her meds. Missing from her family. She mentions the kidnapping of her child and she does not have a child." Pt ambulatory and cooperative with triage.

## 2019-10-27 NOTE — ED Notes (Signed)
Vague to questions asked after she had a snack but states she has a place to live. Informed of process and that she is on an IVC. She states she hasnt had any change in her appetite or sleep.

## 2019-10-28 DIAGNOSIS — F29 Unspecified psychosis not due to a substance or known physiological condition: Secondary | ICD-10-CM

## 2019-10-28 NOTE — Progress Notes (Signed)
Received Megan Monroe this PM standing in the door way of her room.She requested her clothes to go home. The IVC process was explained to her in details, including she cannot leave at this time. She continued to be restless, but eventually calmed down and laid down in her bed. The sitter remains at the bedside. She slept throughout the night.

## 2019-10-28 NOTE — BH Assessment (Signed)
[facility] Assessment Progress Note  Per Berneice Heinrich, FNP, this pt requires psychiatric hospitalization at this time.  Pt presents under IVC initiated by pt's cousin, and upheld by Reola Calkins, NP.  The following facilities have been contacted to seek placement for this pt, with results as noted:  Beds available, information sent, decision pending: [facility] beds were available this morning)   Declined: [facility] (due to pt and unit acuity)   Doylene Canning, MA Behavioral Health Coordinator [phone]

## 2019-10-28 NOTE — Consult Note (Signed)
Telepsych Consultation   Reason for Consult: Involuntary committed Referring Physician: [facility], EDP Location of Patient: [facility] emergency department Location of Provider: [facility]  Patient Identification: Megan Monroe MRN:  [id number] Principal Diagnosis: Psychosis [facility]) Diagnosis:  Principal Problem:   Psychosis ([place])   Total Time spent with patient: 30 minutes  Subjective:   Megan Monroe is a 24 y.o. female patient admitted with paranoia and delusions.  Patient assessed by nurse practitioner.  Patient alert and oriented, answers appropriately at times, answers inappropriately at times.  Patient pleasant and cooperative.  Patient reports "I came to the emergency room to check on my pregnancy, I would like 6 or 7 months pregnant."  Patient pregnancy test results negative at this time.  Patient denies suicidal and homicidal ideations.  Patient denies history of self-harm, patient denies access to weapons.  Patient denies symptoms of paranoia.  Patient denies alcohol and substance use. Patient assessment completed via telemedicine.  Patient appears to be responding to internal stimuli at this time.  Patient looks toward corners of room, no one present in room.  Patient gestures with hands appears to make circular motion in air.  Case discussed with Dr. Mallie Darting. Inpatient treatment recommended at this time.  HPI: Patient involuntarily committed.  Patient appears delusional, patient appears to be responding to internal stimuli at this time.  Past Psychiatric History: Unknown  Risk to Self: Suicidal Ideation: No Suicidal Intent: No Is patient at risk for suicide?: No Suicidal Plan?: No Access to Means: (UTA) What has been your use of drugs/alcohol within the last 12 months?: pt denied How many times?: (UTA) Other Self Harm Risks: (UTA) Triggers for Past Attempts: None known Intentional Self Injurious Behavior: None Risk to Others: Homicidal Ideation:  No Thoughts of Harm to Others: No Current Homicidal Intent: No Current Homicidal Plan: No Access to Homicidal Means: No History of harm to others?: No Assessment of Violence: None Noted Does patient have access to weapons?: (UTA) Criminal Charges Pending?: No Does patient have a court date: No Prior Inpatient Therapy: Prior Inpatient Therapy: No Prior Outpatient Therapy: Prior Outpatient Therapy: No Does patient have an ACCT team?: No Does patient have Intensive In-House Services?  : No Does patient have [facility] services? : No Does patient have P4CC services?: No  Past Medical History:  Past Medical History:  Diagnosis Date  . Exercise-induced asthma    History reviewed. No pertinent surgical history. Family History:  Family History  Problem Relation Age of Onset  . Hypertension Father   . Diabetes Maternal Grandmother   . Heart attack Cousin        Early 63s   Family Psychiatric  History: Unknown Social History:  Social History   Substance and Sexual Activity  Alcohol Use No     Social History   Substance and Sexual Activity  Drug Use No    Social History   Socioeconomic History  . Marital status: Single    Spouse name: Not on file  . Number of children: Not on file  . Years of education: Not on file  . Highest education level: Not on file  Occupational History  . Not on file  Tobacco Use  . Smoking status: Never Smoker  . Smokeless tobacco: Never Used  Substance and Sexual Activity  . Alcohol use: No  . Drug use: No  . Sexual activity: Never  Other Topics Concern  . Not on file  Social History Narrative   Pt lives with mother, father (  or step father, this information was not clarified to me) and grandmother.   Social Determinants of Health   Financial Resource Strain:   . Difficulty of Paying Living Expenses: Not on file  Food Insecurity:   . Worried About Charity fundraiser in the Last Year: Not on file  . Ran Out of Food in the Last Year: Not  on file  Transportation Needs:   . Lack of Transportation (Medical): Not on file  . Lack of Transportation (Non-Medical): Not on file  Physical Activity:   . Days of Exercise per Week: Not on file  . Minutes of Exercise per Session: Not on file  Stress:   . Feeling of Stress : Not on file  Social Connections:   . Frequency of Communication with Friends and Family: Not on file  . Frequency of Social Gatherings with Friends and Family: Not on file  . Attends Religious Services: Not on file  . Active Member of Clubs or Organizations: Not on file  . Attends Archivist Meetings: Not on file  . Marital Status: Not on file   Additional Social History:    Allergies:  No Known Allergies  Labs:  Results for orders placed or performed during the hospital encounter of 10/27/19 (from the past 48 hour(s))  Comprehensive metabolic panel     Status: None   Collection Time: 10/27/19  3:00 AM  Result Value Ref Range   Sodium 138 135 - 145 mmol/L   Potassium 3.5 3.5 - 5.1 mmol/L   Chloride 104 98 - 111 mmol/L   CO2 24 22 - 32 mmol/L   Glucose, Bld 92 70 - 99 mg/dL   BUN 14 6 - 20 mg/dL   Creatinine, Ser 0.78 0.44 - 1.00 mg/dL   Calcium 9.2 8.9 - 10.3 mg/dL   Total Protein 7.2 6.5 - 8.1 g/dL   Albumin 4.1 3.5 - 5.0 g/dL   AST 23 15 - 41 U/L   ALT 18 0 - 44 U/L   Alkaline Phosphatase 60 38 - 126 U/L   Total Bilirubin 0.7 0.3 - 1.2 mg/dL   GFR calc non Af Amer >60 >60 mL/min   GFR calc Af Amer >60 >60 mL/min   Anion gap 10 5 - 15    Comment: Performed at [facility], [place] [address], [place], [place] [address]  Ethanol     Status: None   Collection Time: 10/27/19  3:00 AM  Result Value Ref Range   Alcohol, Ethyl (B) <10 <10 mg/dL    Comment: (NOTE) Lowest detectable limit for serum alcohol is 10 mg/dL. For medical purposes only. Performed at [facility], [place] [address], [place], [place] [address]   Salicylate level     Status: Abnormal    Collection Time: 10/27/19  3:00 AM  Result Value Ref Range   Salicylate Lvl <9.7 (L) 7.0 - 30.0 mg/dL    Comment: Performed at [facility], [place] [address] [place], [place] [address]  Acetaminophen level     Status: Abnormal   Collection Time: 10/27/19  3:00 AM  Result Value Ref Range   Acetaminophen (Tylenol), Serum <10 (L) 10 - 30 ug/mL    Comment: (NOTE) Therapeutic concentrations vary significantly. A range of 10-30 ug/mL  may be an effective concentration for many patients. However, some  are best treated at concentrations outside of this range. Acetaminophen concentrations >150 ug/mL at 4 hours after ingestion  and >50 ug/mL at  12 hours after ingestion are often associated with  toxic reactions. Performed at [facility], [place] [address] [place], [place] [address]   cbc     Status: None   Collection Time: 10/27/19  3:00 AM  Result Value Ref Range   WBC 7.5 4.0 - 10.5 K/uL   RBC 4.92 3.87 - 5.11 MIL/uL   Hemoglobin 13.2 12.0 - 15.0 g/dL   HCT 41.0 36.0 - 46.0 %   MCV 83.3 80.0 - 100.0 fL   MCH 26.8 26.0 - 34.0 pg   MCHC 32.2 30.0 - 36.0 g/dL   RDW 15.5 11.5 - 15.5 %   Platelets 268 150 - 400 K/uL   nRBC 0.0 0.0 - 0.2 %    Comment: Performed at [facility], [place] [address], [place], [place] [address]  I-Stat beta hCG blood, ED     Status: None   Collection Time: 10/27/19  3:05 AM  Result Value Ref Range   I-stat hCG, quantitative <5.0 <5 mIU/mL   Comment 3            Comment:   GEST. AGE      CONC.  (mIU/mL)   <=1 WEEK        5 - 50     2 WEEKS       50 - 500     3 WEEKS       100 - 10,000     4 WEEKS     1,000 - 30,000        FEMALE AND NON-PREGNANT FEMALE:     LESS THAN 5 mIU/mL   Rapid urine drug screen (hospital performed)     Status: Abnormal   Collection Time: 10/27/19 10:50 AM  Result Value Ref Range   Opiates NONE DETECTED NONE DETECTED   Cocaine NONE DETECTED NONE DETECTED   Benzodiazepines NONE  DETECTED NONE DETECTED   Amphetamines NONE DETECTED NONE DETECTED   Tetrahydrocannabinol POSITIVE (A) NONE DETECTED   Barbiturates NONE DETECTED NONE DETECTED    Comment: (NOTE) DRUG SCREEN FOR MEDICAL PURPOSES ONLY.  IF CONFIRMATION IS NEEDED FOR ANY PURPOSE, NOTIFY LAB WITHIN 5 DAYS. LOWEST DETECTABLE LIMITS FOR URINE DRUG SCREEN Drug Class                     Cutoff (ng/mL) Amphetamine and metabolites    1000 Barbiturate and metabolites    200 Benzodiazepine                 410 Tricyclics and metabolites     300 Opiates and metabolites        300 Cocaine and metabolites        300 THC                            50 Performed at [facility], [place] [address], [place], [place] [address]   POC SARS Coronavirus 2 Ag-ED - Nasal Swab (BD Veritor Kit)     Status: None   Collection Time: 10/27/19 12:47 PM  Result Value Ref Range   SARS Coronavirus 2 Ag NEGATIVE NEGATIVE    Comment: (NOTE) SARS-CoV-2 antigen NOT DETECTED.  Negative results are presumptive.  Negative results do not preclude SARS-CoV-2 infection and should not be used as the sole basis for treatment or other patient management decisions, including infection  control decisions, particularly in the presence of clinical signs and  symptoms  consistent with COVID-19, or in those who have been in contact with the virus.  Negative results must be combined with clinical observations, patient history, and epidemiological information. The expected result is Negative. Fact Sheet for Patients: PodPark.tn Fact Sheet for Healthcare Providers: GiftContent.is This test is not yet approved or cleared by the [place] FDA and  has been authorized for detection and/or diagnosis of SARS-CoV-2 by FDA under an Emergency Use Authorization (EUA).  This EUA will remain in effect (meaning this test can be used) for the duration of  the COVID-19 de claration under  Section 564(b)(1) of the Act, 21 U.S.C. section 360bbb-3(b)(1), unless the authorization is terminated or revoked sooner.     Medications:  No current facility-administered medications for this encounter.   Current Outpatient Medications  Medication Sig Dispense Refill  . metroNIDAZOLE (FLAGYL) 500 MG tablet Take 1 tablet (500 mg total) by mouth 2 (two) times daily. One po bid x 7 days (Patient not taking: Reported on 10/27/2019) 14 tablet 0    Musculoskeletal: Strength & Muscle Tone: within normal limits Gait & Station: normal Patient leans: N/A  Psychiatric Specialty Exam: Physical Exam  Nursing note and vitals reviewed. Constitutional: She is oriented to person, place, and time. She appears well-developed.  HENT:  Head: Normocephalic.  Cardiovascular: Normal rate.  Respiratory: Effort normal.  Neurological: She is alert and oriented to person, place, and time.  Psychiatric: She has a normal mood and affect. Her speech is normal and behavior is normal. Thought content is delusional. Cognition and memory are impaired. She expresses impulsivity.    Review of Systems  Constitutional: Negative.   HENT: Negative.   Eyes: Negative.   Respiratory: Negative.   Cardiovascular: Negative.   Gastrointestinal: Negative.   Genitourinary: Negative.   Musculoskeletal: Negative.   Skin: Negative.   Neurological: Negative.   Psychiatric/Behavioral: Positive for hallucinations.    Blood pressure (!) 100/56, pulse 76, temperature 99 F (37.2 C), temperature source Oral, resp. rate 18, height '5\' 2"'  (1.575 m), SpO2 99 %.Body mass index is 22.86 kg/m.  General Appearance: Casual  Eye Contact:  Fair  Speech:  Clear and Coherent and Normal Rate  Volume:  Normal  Mood:  Anxious  Affect:  Congruent  Thought Process:  Coherent, Goal Directed and Descriptions of Associations: Tangential  Orientation:  Full (Time, Place, and Person)  Thought Content:  Delusions  Suicidal Thoughts:  No   Homicidal Thoughts:  No  Memory:  Immediate;   Poor Recent;   Fair Remote;   Fair  Judgement:  Impaired  Insight:  Lacking  Psychomotor Activity:  Normal  Concentration:  Concentration: Fair and Attention Span: Fair  Recall:  [employer] of Knowledge:  Fair  Language:  Fair  Akathisia:  No  Handed:  Right  AIMS (if indicated):     Assets:  Communication Skills Desire for Improvement Financial Resources/Insurance Social Support  ADL's:  Intact  Cognition:  WNL  Sleep:        Treatment Plan Summary: Daily contact with patient to assess and evaluate symptoms and progress in treatment  Disposition: Recommend psychiatric Inpatient admission when medically cleared. Supportive therapy provided about ongoing stressors.  This service was provided via telemedicine using a 2-way, interactive audio and video technology.  Names of all persons participating in this telemedicine service and their role in this encounter. Name: Megan Monroe Role: Patient  Name: Letitia Libra Role: [place], [place] 10/28/2019 1:38 PM

## 2019-10-29 NOTE — ED Notes (Signed)
[employer] contacted to give patient ride to [employer].

## 2019-10-29 NOTE — ED Notes (Signed)
Report given to [facility] at [facility].

## 2019-10-29 NOTE — BH Assessment (Signed)
BHH Assessment Progress Note    Patient has been accepted to [facility] by Dr. Betti Cruz.  She can arrive to the [facility] Unit this morning.  Report will need to be called to [phone] prior to her discharge.

## 2019-10-29 NOTE — ED Notes (Signed)
[facility] returned call, stated they will be here around 0930 to get patient.

## 2020-01-08 ENCOUNTER — Emergency Department (HOSPITAL_COMMUNITY)
Admission: EM | Admit: 2020-01-08 | Discharge: 2020-01-08 | Disposition: A | Discharge status: Home / Self Care | Payer: Self-pay | Attending: Emergency Medicine | Admitting: Emergency Medicine

## 2020-01-08 ENCOUNTER — Other Ambulatory Visit: Payer: Self-pay

## 2020-01-08 DIAGNOSIS — Z760 Encounter for issue of repeat prescription: Secondary | ICD-10-CM | POA: Insufficient documentation

## 2020-01-08 NOTE — Discharge Instructions (Signed)
Refer to the attached information regarding behavioral health resources. Return to the emergency department for thoughts of self-harm, suicidal thoughts, thoughts of hurting others.

## 2020-01-08 NOTE — ED Triage Notes (Addendum)
Pt to Ed with c.o of needing to refill her behavioral health medication. Pt states she has been out for the past 3 weeks and has tried contacting her DR in [place] about it but has not been able to get it refilled. Patient cannot recall the name of the medicine but states was taking for depression.

## 2020-01-08 NOTE — ED Provider Notes (Signed)
[facility]-EMERGENCY DEPT Provider Note   CSN: [id number] Arrival date & time: 01/08/20  1615     History Chief Complaint  Patient presents with  . Medication Refill    Cherron Blitzer is a 24 y.o. female.  HPI      Maryln Eastham is a 24 y.o. female, with a history of asthma and psychosis, presenting to the ED requesting refills of her psychiatric medications.  She states she has not had them for at least 3 weeks. She does not remember the names of these medications. She states she was given written prescriptions for these medications from [facility] behavioral health clinic, gave them to a friend to get them filled at a pharmacy, but then the friend never filled them and told her they lost the prescription. She states, "I called the clinic and they said they could write me new prescriptions.  I was just trying to avoid driving to [place], but I can do that if I need to."  She came to the ED thinking we may have records from the clinic and be able to look up these medications. She denies SI/HI.  Denies A/V hallucinations.  Denies physical complaints.     Past Medical History:  Diagnosis Date  . Exercise-induced asthma     Patient Active Problem List   Diagnosis Date Noted  . Psychosis (HCC) 10/28/2019  . Posterior tibial tendinitis 08/20/2012  . Reactive airway disease 10/15/2011    No past surgical history on file.   OB History   No obstetric history on file.     Family History  Problem Relation Age of Onset  . Hypertension Father   . Diabetes Maternal Grandmother   . Heart attack Cousin        Early 30s    Social History   Tobacco Use  . Smoking status: Never Smoker  . Smokeless tobacco: Never Used  Substance Use Topics  . Alcohol use: No  . Drug use: No    Home Medications Prior to Admission medications   Medication Sig Start Date End Date Taking? Authorizing Provider  metroNIDAZOLE (FLAGYL) 500 MG tablet Take 1  tablet (500 mg total) by mouth 2 (two) times daily. One po bid x 7 days Patient not taking: Reported on 10/27/2019 10/07/19   Molpus, John, MD  albuterol (PROVENTIL) (2.5 MG/3ML) 0.083% nebulizer solution Take 3 mLs (2.5 mg total) by nebulization every 6 (six) hours as needed for wheezing or shortness of breath. Patient not taking: Reported on 10/06/2019 10/15/11 10/07/19  Edd Arbour, MD    Allergies    Patient has no known allergies.  Review of Systems   Review of Systems  Constitutional:       Medication refill  Respiratory: Negative for shortness of breath.   Cardiovascular: Negative for chest pain.  Gastrointestinal: Negative for nausea and vomiting.  Neurological: Negative for headaches.  Psychiatric/Behavioral: Negative for agitation, behavioral problems, confusion, dysphoric mood, hallucinations, sleep disturbance and suicidal ideas. The patient is not nervous/anxious.     Physical Exam Updated Vital Signs BP 125/72 (BP Location: Right Arm)   Pulse 89   Resp 16   SpO2 97%   Physical Exam Vitals and nursing note reviewed.  Constitutional:      General: She is not in acute distress.    Appearance: She is well-developed. She is not diaphoretic.  HENT:     Head: Normocephalic and atraumatic.  Eyes:     Conjunctiva/sclera: Conjunctivae normal.  Cardiovascular:  Rate and Rhythm: Normal rate and regular rhythm.  Pulmonary:     Effort: Pulmonary effort is normal.  Musculoskeletal:     Cervical back: Neck supple.  Skin:    General: Skin is warm and dry.     Coloration: Skin is not pale.  Neurological:     Mental Status: She is alert and oriented to person, place, and time.  Psychiatric:        Mood and Affect: Mood normal.        Behavior: Behavior normal.        Thought Content: Thought content normal.     Comments: I had a long conversation with the patient to try to establish her thought content and assure as much as possible that she did not have any  presentation consistent with acute psychosis. Her eye contact was normal.  Her conversational cadence was normal.  Her timeline of events was similar each time she recounted them.  There did not seem to be any bizarre thought content. She did not show signs of responding to internal stimuli.     ED Results / Procedures / Treatments   Labs (all labs ordered are listed, but only abnormal results are displayed) Labs Reviewed - No data to display  EKG None  Radiology No results found.  Procedures Procedures (including critical care time)  Medications Ordered in ED Medications - No data to display  ED Course  I have reviewed the triage vital signs and the nursing notes.  Pertinent labs & imaging results that were available during my care of the patient were reviewed by me and considered in my medical decision making (see chart for details).    MDM Rules/Calculators/A&P                      Patient presents to inquire about reissuance of prescriptions that were reportedly lost. I reviewed the patient's records that were available to me at the time of this visit, which were limited to the records within our [facility] health system.  There were no Care Everywhere records available. I was unable to find a record of any prescribed psychiatric medications for the patient. She does not have a presentation suggestive of acute psychiatric decompensation or psychosis. She seems to have a logical plan for obtaining her prescriptions. Return precautions were discussed.  Patient voices understanding of these instructions, accepts the plan, and is comfortable with discharge.     Final Clinical Impression(s) / ED Diagnoses Final diagnoses:  Medication refill    Rx / DC Orders ED Discharge Orders    None       Layla Maw 01/09/20 1625    Dorie Rank, MD 01/11/20 [phone]

## 2021-03-28 IMAGING — US US ART/VEN ABD/PELV/SCROTUM DOPPLER LTD
2 series · 13 of 25 positions shown · non-contrast
Comparison: None.

CLINICAL DATA: Vaginal bleeding

EXAM:
TRANSABDOMINAL ULTRASOUND OF PELVIS
DOPPLER ULTRASOUND OF OVARIES
TECHNIQUE: Transabdominal ultrasound examination of the pelvis was performed
including evaluation of the uterus, ovaries, adnexal regions, and
pelvic cul-de-sac.
Color and duplex Doppler ultrasound was utilized to evaluate blood
flow to the ovaries. Endovaginal evaluation was not performed due to
patient wishes.

[Series 1: us art/ven abd/pelv/scrotum doppler ltd · 12 of 51 slices shown (1 of 2)]
[im 1/51]
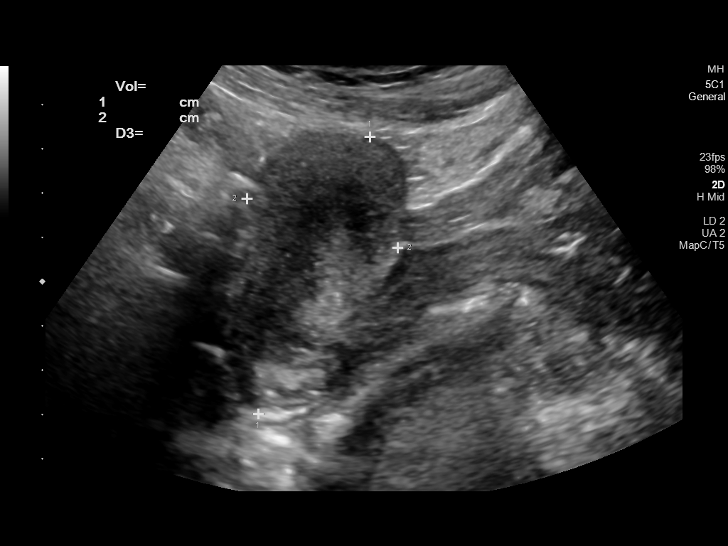
[im 5/51]
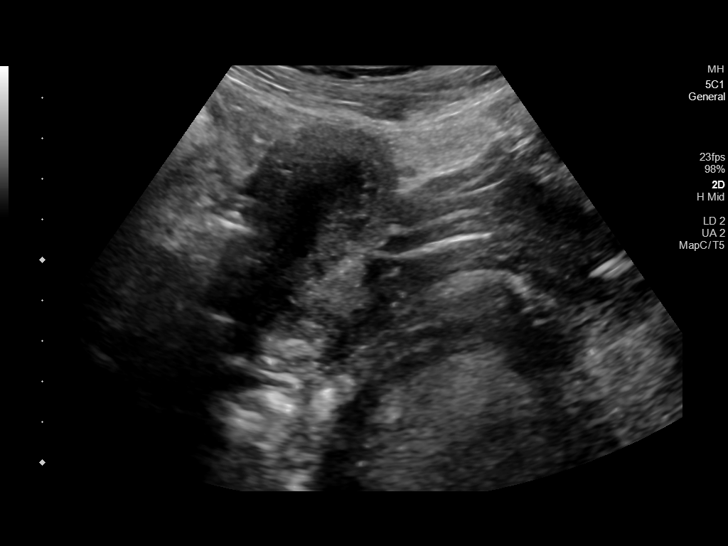
[im 9/51]
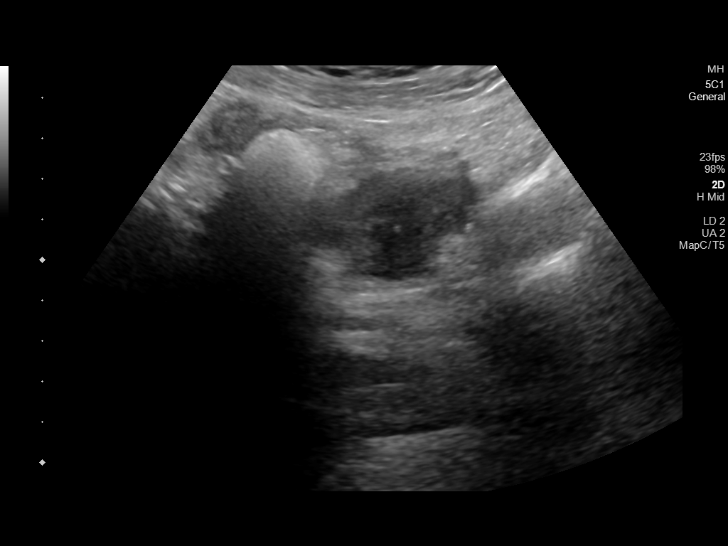
[im 14/51]
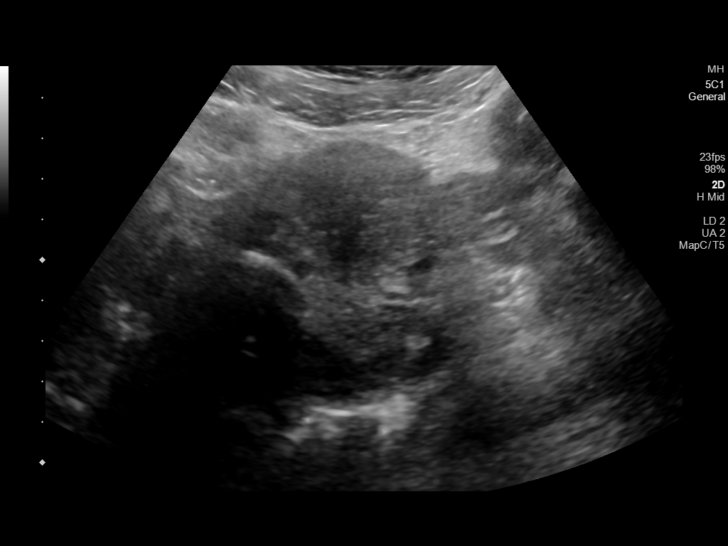
[im 18/51]
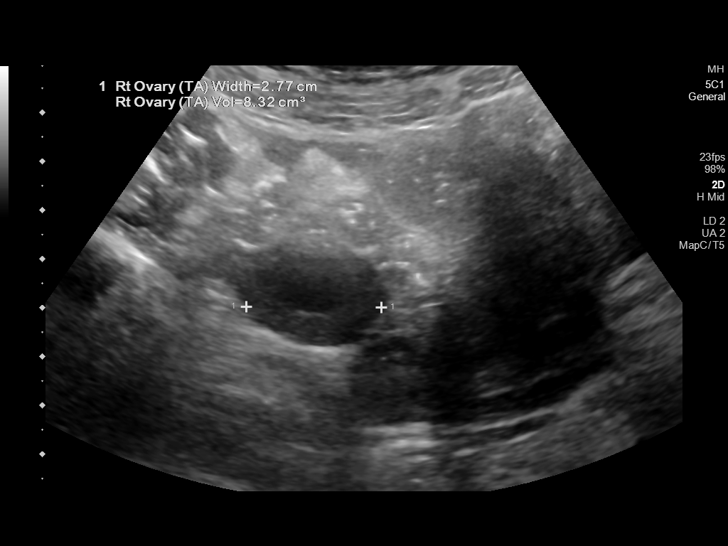
[im 22/51]
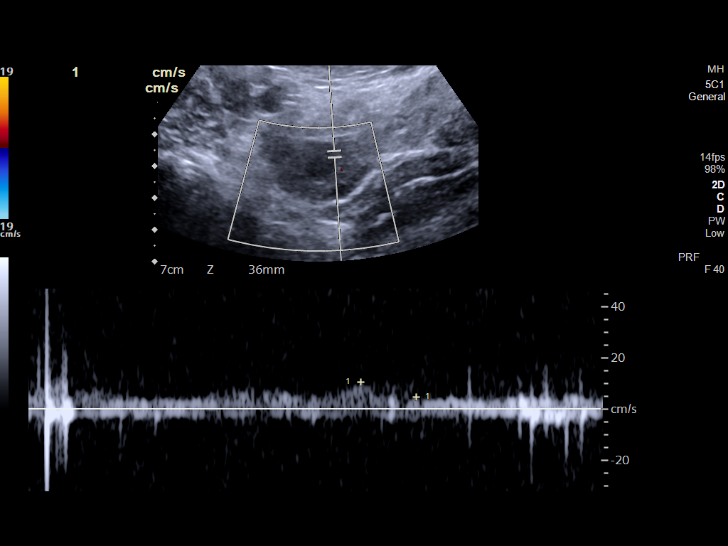
[im 27/51]
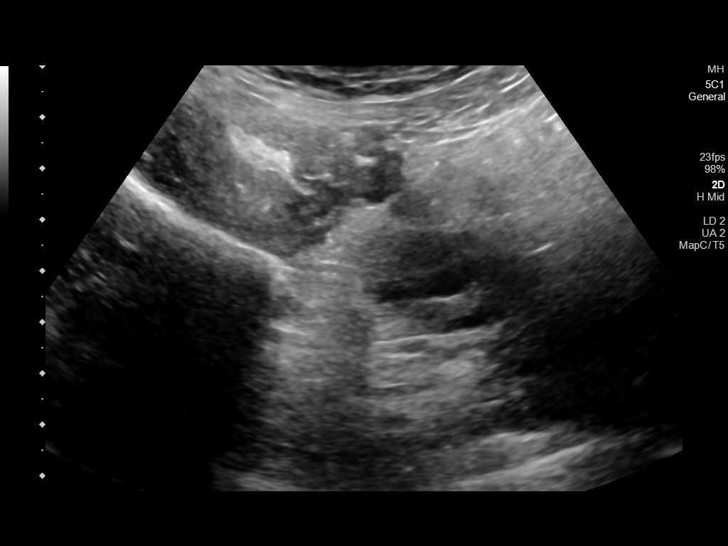
[im 31/51]
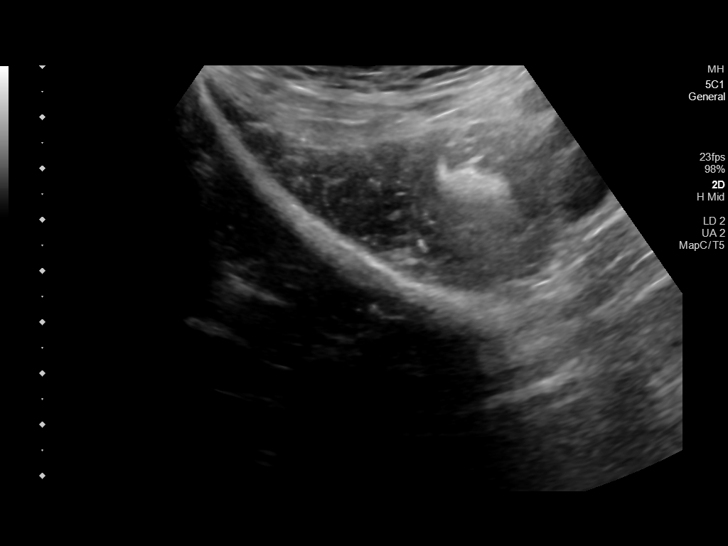
[im 35/51]
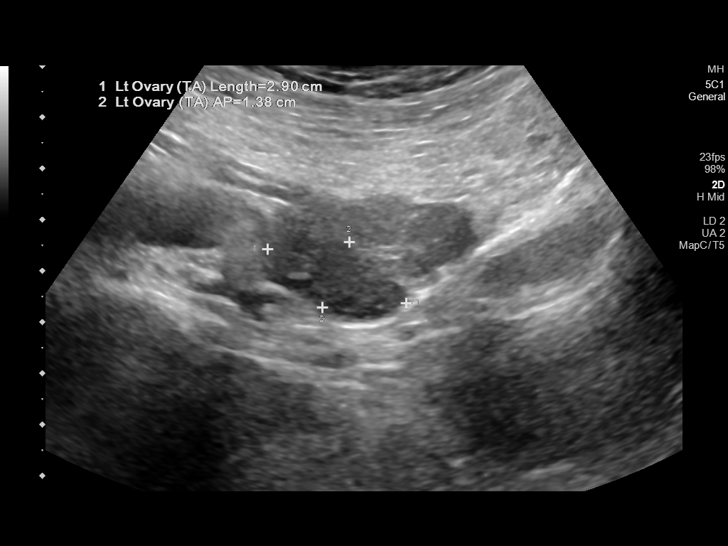
[im 40/51]
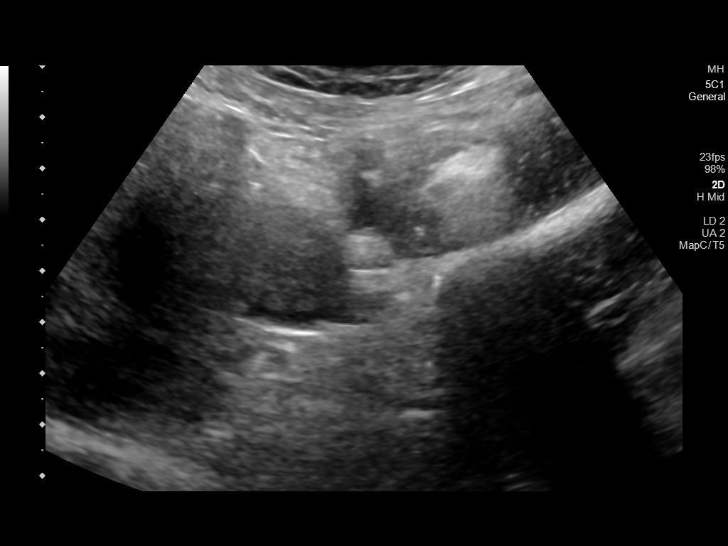
[im 44/51]
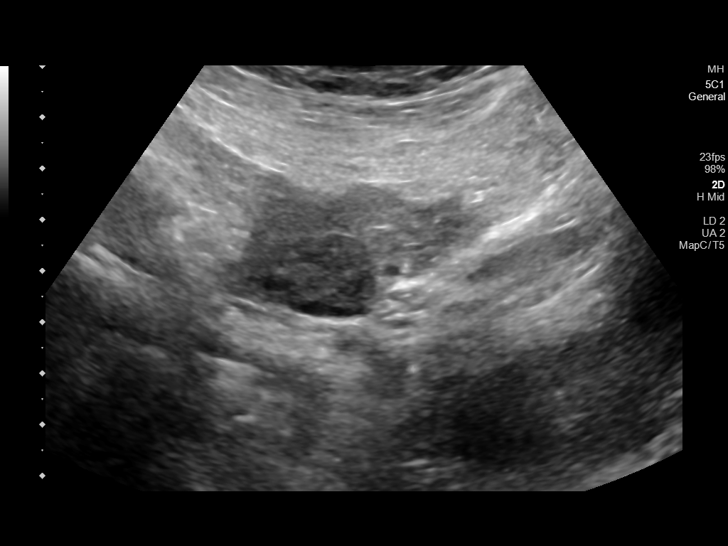
[im 48/51]
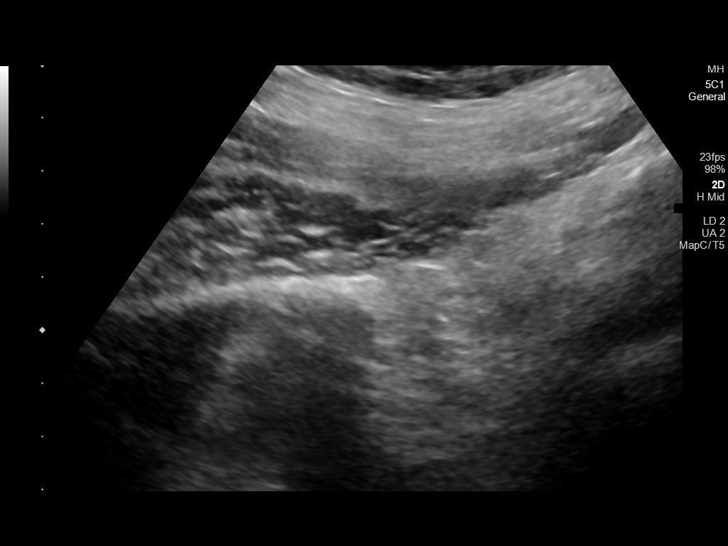

[Series 3: us art/ven abd/pelv/scrotum doppler ltd · 1 of 1 slices shown (2 of 2)]
[im 1/1]
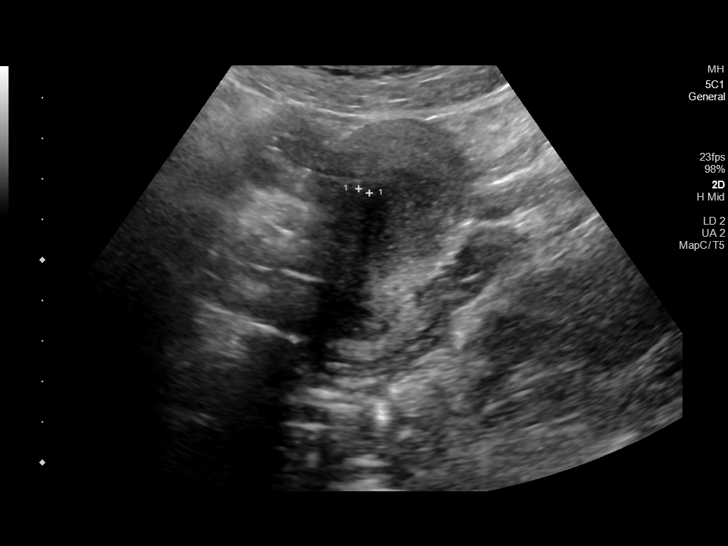

[13 of 25 positions shown; findings below may reference images not displayed]

FINDINGS: Uterus

Measurements: 6.8 x 3.6 x 4.4 cm = volume: 55 mL. Limited assessment
without focal abnormality. Endovaginal exam not performed.

Endometrium

Thickness: 3 mm.  Limited assessment without focal abnormality

Right ovary

Measurements: 3.1 x 1.9 x 2.8 cm = volume: 8.3 mL. Normal
appearance/no adnexal mass.

Left ovary

Measurements: 2.9 x 1.4 x 1.8 cm = volume: 3.7 mL. Normal
appearance/no adnexal mass.

Pulsed Doppler evaluation demonstrates normal low-resistance
arterial and venous waveforms in both ovaries.

Other: Urinary bladder is under distended limiting the exam.
IMPRESSION: 1. No signs of ovarian torsion or free fluid.
2. Limited assessment due to lack of endovaginal examination at the
patient's request. In

## 2021-03-28 IMAGING — US US PELVIS COMPLETE
2 series · 13 of 25 positions shown · non-contrast
Comparison: None.

CLINICAL DATA: Vaginal bleeding

EXAM:
TRANSABDOMINAL ULTRASOUND OF PELVIS
DOPPLER ULTRASOUND OF OVARIES
TECHNIQUE: Transabdominal ultrasound examination of the pelvis was performed
including evaluation of the uterus, ovaries, adnexal regions, and
pelvic cul-de-sac.
Color and duplex Doppler ultrasound was utilized to evaluate blood
flow to the ovaries. Endovaginal evaluation was not performed due to
patient wishes.

[Series 1: us pelvis complete · 12 of 51 slices shown (1 of 2)]
[im 1/51]
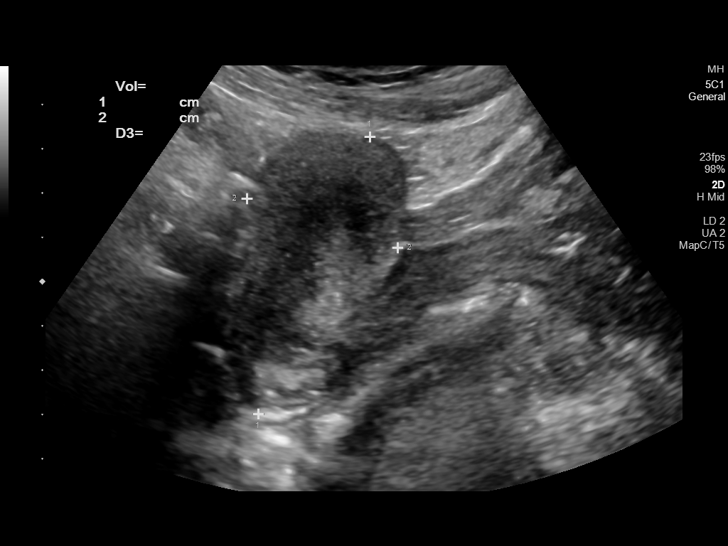
[im 5/51]
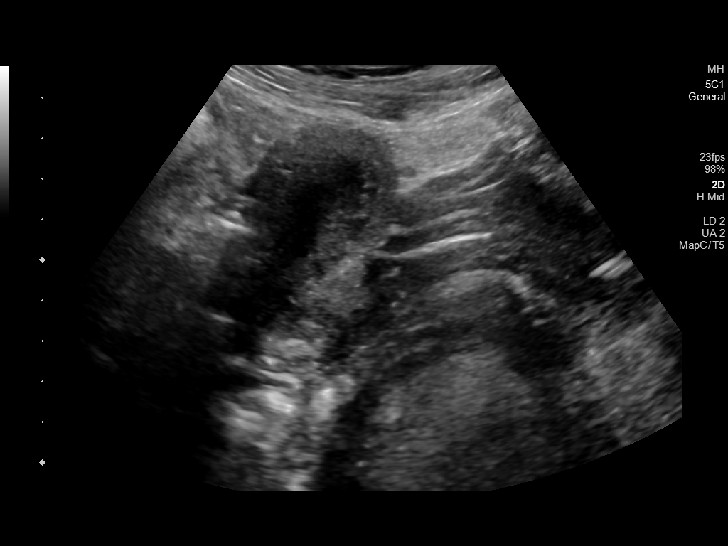
[im 9/51]
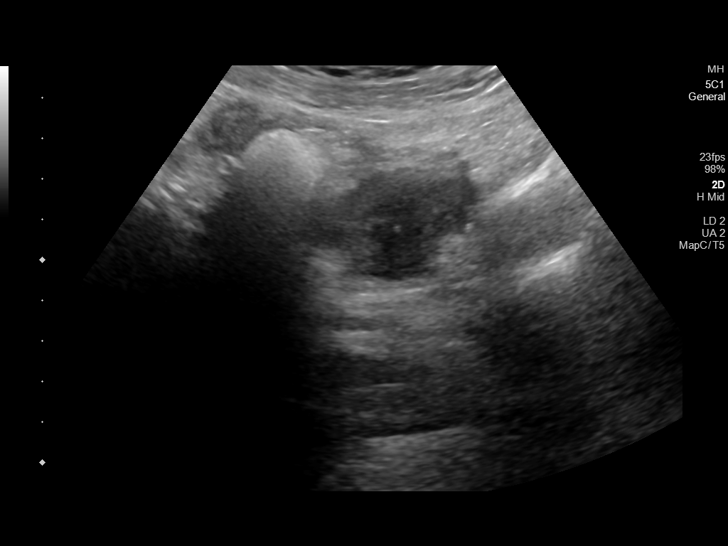
[im 14/51]
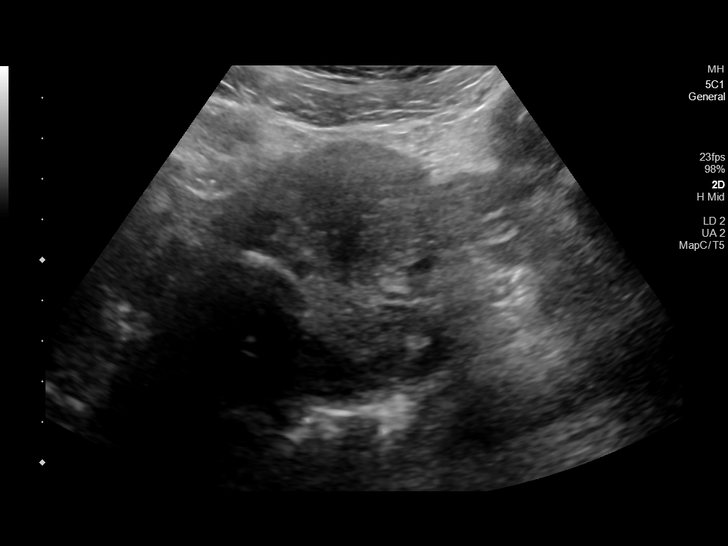
[im 18/51]
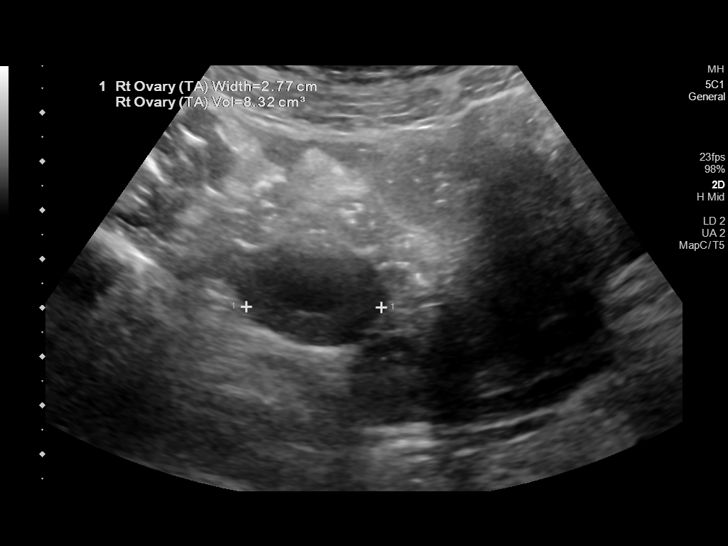
[im 22/51]
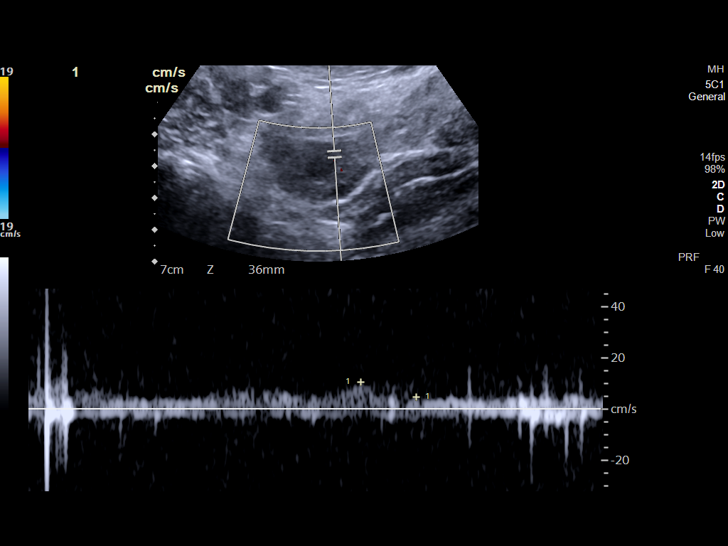
[im 27/51]
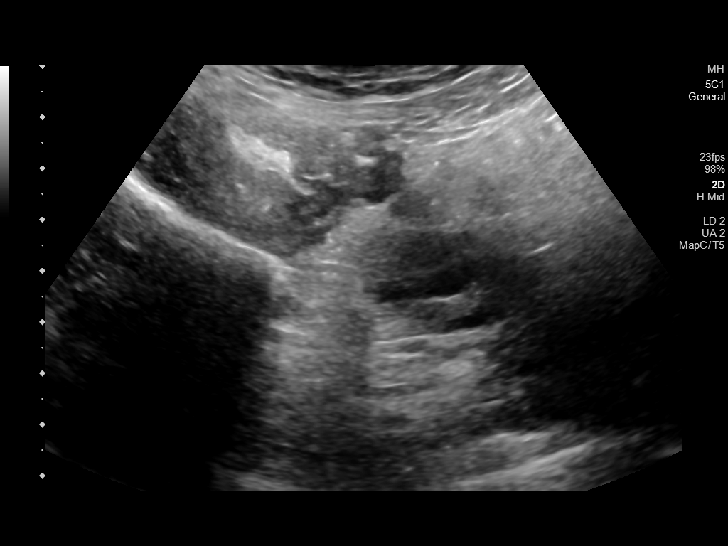
[im 31/51]
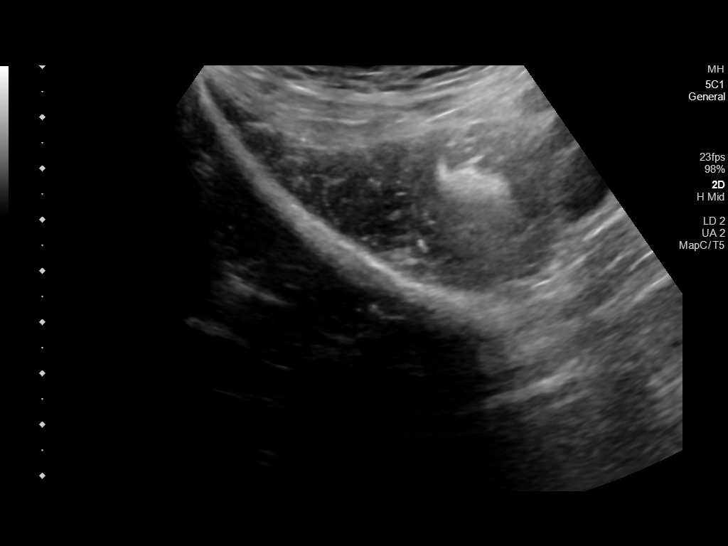
[im 35/51]
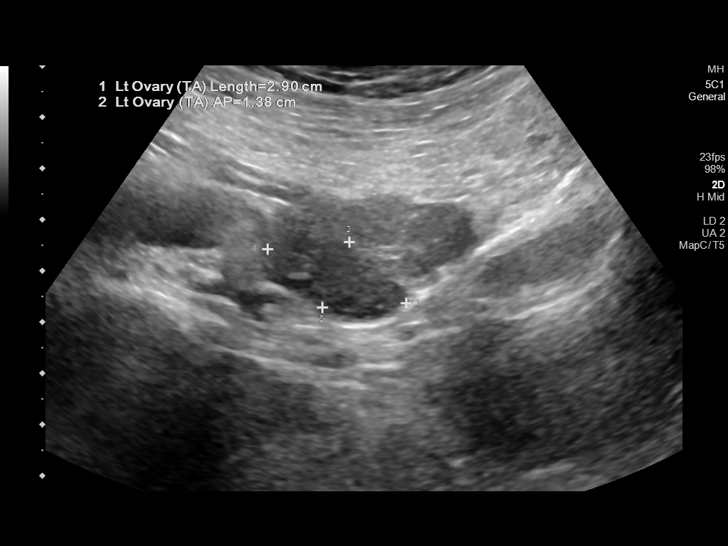
[im 40/51]
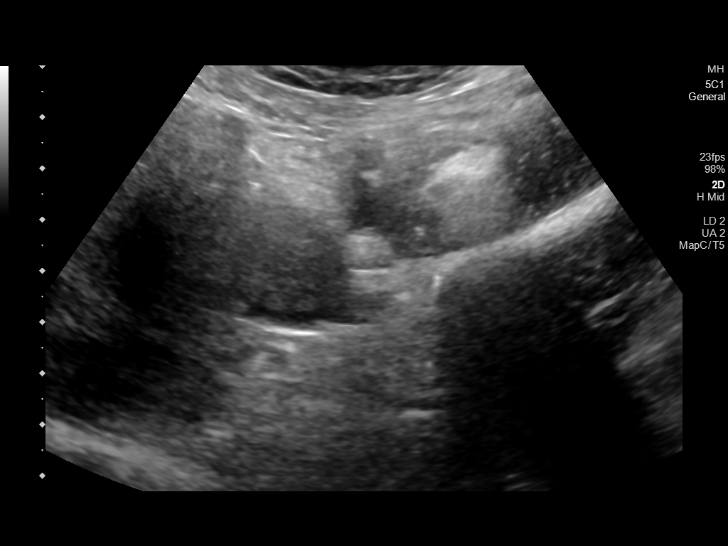
[im 44/51]
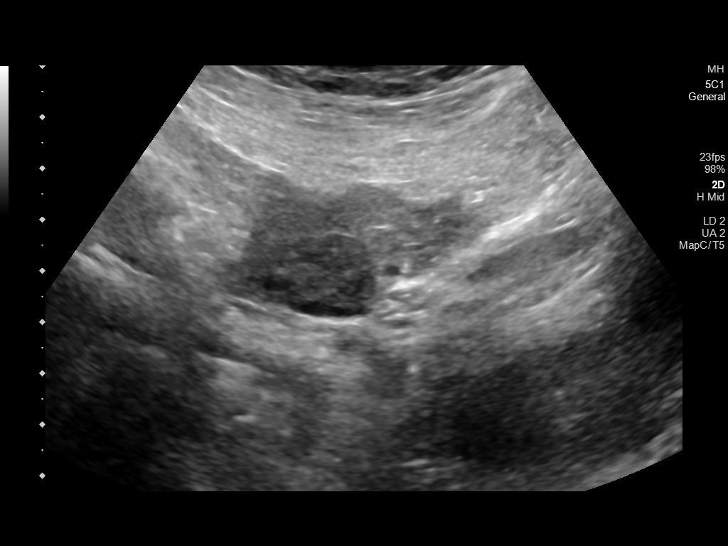
[im 48/51]
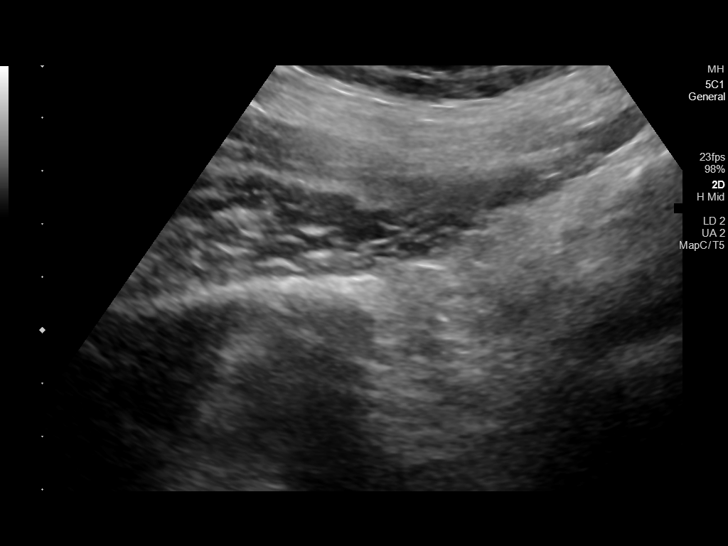

[Series 3: us pelvis complete · 1 of 1 slices shown (2 of 2)]
[im 1/1]
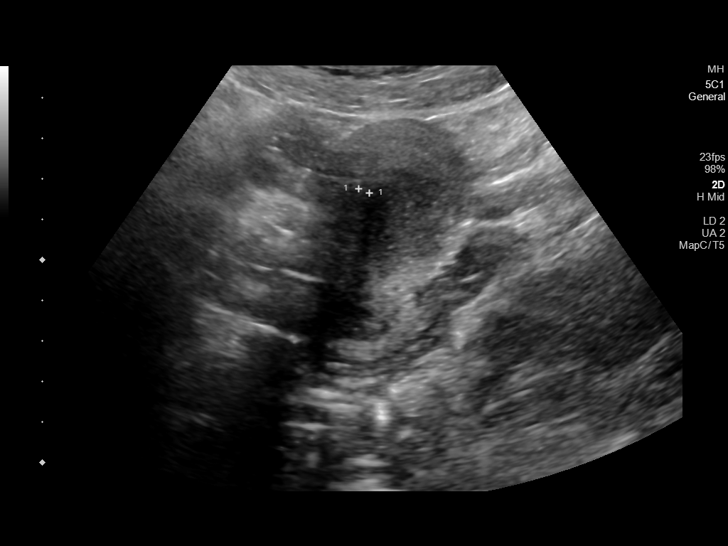

[13 of 25 positions shown; findings below may reference images not displayed]

FINDINGS: Uterus

Measurements: 6.8 x 3.6 x 4.4 cm = volume: 55 mL. Limited assessment
without focal abnormality. Endovaginal exam not performed.

Endometrium

Thickness: 3 mm.  Limited assessment without focal abnormality

Right ovary

Measurements: 3.1 x 1.9 x 2.8 cm = volume: 8.3 mL. Normal
appearance/no adnexal mass.

Left ovary

Measurements: 2.9 x 1.4 x 1.8 cm = volume: 3.7 mL. Normal
appearance/no adnexal mass.

Pulsed Doppler evaluation demonstrates normal low-resistance
arterial and venous waveforms in both ovaries.

Other: Urinary bladder is under distended limiting the exam.
IMPRESSION: 1. No signs of ovarian torsion or free fluid.
2. Limited assessment due to lack of endovaginal examination at the
patient's request. In

## 2023-01-12 ENCOUNTER — Encounter (HOSPITAL_COMMUNITY): Payer: Self-pay | Admitting: Behavioral Health

## 2023-01-12 ENCOUNTER — Ambulatory Visit (HOSPITAL_COMMUNITY)
Admission: EM | Admit: 2023-01-12 | Discharge: 2023-01-13 | Disposition: A | Discharge status: Other Acute Inpatient Hospital | Payer: Medicaid Other | Attending: Behavioral Health | Admitting: Behavioral Health

## 2023-01-12 DIAGNOSIS — F29 Unspecified psychosis not due to a substance or known physiological condition: Secondary | ICD-10-CM | POA: Diagnosis present

## 2023-01-12 DIAGNOSIS — F259 Schizoaffective disorder, unspecified: Secondary | ICD-10-CM | POA: Insufficient documentation

## 2023-01-12 DIAGNOSIS — F319 Bipolar disorder, unspecified: Secondary | ICD-10-CM | POA: Insufficient documentation

## 2023-01-12 DIAGNOSIS — R45851 Suicidal ideations: Secondary | ICD-10-CM | POA: Insufficient documentation

## 2023-01-12 DIAGNOSIS — Z79899 Other long term (current) drug therapy: Secondary | ICD-10-CM | POA: Insufficient documentation

## 2023-01-12 DIAGNOSIS — Z1152 Encounter for screening for COVID-19: Secondary | ICD-10-CM | POA: Insufficient documentation

## 2023-01-12 LAB — RESP PANEL BY RT-PCR (RSV, FLU A&B, COVID)  RVPGX2
Influenza A by PCR: NEGATIVE
Influenza B by PCR: NEGATIVE
Resp Syncytial Virus by PCR: NEGATIVE
SARS Coronavirus 2 by RT PCR: NEGATIVE

## 2023-01-12 LAB — CBC WITH DIFFERENTIAL/PLATELET
Abs Immature Granulocytes: 0.08 10*3/uL — ABNORMAL HIGH (ref 0.00–0.07)
Basophils Absolute: 0 10*3/uL (ref 0.0–0.1)
Basophils Relative: 1 %
Eosinophils Absolute: 0.2 10*3/uL (ref 0.0–0.5)
Eosinophils Relative: 2 %
HCT: 36 % (ref 36.0–46.0)
Hemoglobin: 11.4 g/dL — ABNORMAL LOW (ref 12.0–15.0)
Immature Granulocytes: 1 %
Lymphocytes Relative: 29 %
Lymphs Abs: 2.5 10*3/uL (ref 0.7–4.0)
MCH: 25.2 pg — ABNORMAL LOW (ref 26.0–34.0)
MCHC: 31.7 g/dL (ref 30.0–36.0)
MCV: 79.6 fL — ABNORMAL LOW (ref 80.0–100.0)
Monocytes Absolute: 0.5 10*3/uL (ref 0.1–1.0)
Monocytes Relative: 6 %
Neutro Abs: 5.4 10*3/uL (ref 1.7–7.7)
Neutrophils Relative %: 61 %
Platelets: 678 10*3/uL — ABNORMAL HIGH (ref 150–400)
RBC: 4.52 MIL/uL (ref 3.87–5.11)
RDW: 15.7 % — ABNORMAL HIGH (ref 11.5–15.5)
WBC: 8.6 10*3/uL (ref 4.0–10.5)
nRBC: 0 % (ref 0.0–0.2)

## 2023-01-12 LAB — ETHANOL: Alcohol, Ethyl (B): <10 mg/dL (ref <10)

## 2023-01-12 LAB — POC SARS CORONAVIRUS 2 AG: SARSCOV2ONAVIRUS 2 AG: NEGATIVE

## 2023-01-12 MED ORDER — PALIPERIDONE ER 3 MG PO TB24
3.0000 mg | ORAL_TABLET | Freq: Once | ORAL | Status: AC
  Administered 2023-01-12: 3 mg via ORAL
  Filled 2023-01-12: qty 1

## 2023-01-12 NOTE — ED Notes (Signed)
Received patient this PM. Patient is standing watching TV. Patient respirations are even and unlabored. Will continue to monitor for safety.

## 2023-01-12 NOTE — Progress Notes (Signed)
   01/12/23 1604  [place] (Walk-ins at [facility] only)  How Did You Hear About [place]? Legal System  What Is the Reason for Your Visit/Call Today? URGENT: Megan Monroe is a 27 y/o female. She presents to the [facility]. Denies that she has a psychiatric history. Chart review, indicates a history of psychosis. She is presenting today with a labile mood, appears to be experiencing thought blocking, her demeanor is bizarre and disorganized. She makes poor eye contact. She is unsure of her DOB or age. When asked what brings her here today she states, "Police picked me up from [place]". This is possibly where she lives, however; patient is unable to elaborate. States that she has "A lot of kids" and they are all at home. She denies SI, HI, and AVH's. She denies alcohol/drug use. However, when asked about THC specifically, she giggles. Patient says that she has been hospitalized for psychiatric reasons multiple times, however; unable to provide a reason. States that she is prescribed medications, however; doesn't know the name of her medications, and/or the last time she took any medications. She identifies her dad as her support system, "KG". However, does have any contact information for him.  How Long Has This Been Causing You Problems? <Week  Have You Recently Had Any Thoughts About Hurting Yourself? No  Are You Planning to Commit Suicide/Harm Yourself At This time? No  Have you Recently Had Thoughts About [place]? No  Are You Planning To Harm Someone At This Time? No  Are you currently experiencing any auditory, visual or other hallucinations? No  Have You Used Any Alcohol or Drugs in the Past 24 Hours? No  Do you have any current medical co-morbidities that require immediate attention? No  Clinician description of patient physical appearance/behavior: presenting today with a labile mood, appears to be experiencing thought blocking, her demeanor is bizarre and disorganized. She makes  poor eye contact. She is unsure of her DOB or age  What Do You Feel Would Help You the Most Today? Treatment for Depression or other mood problem;Medication(s);Stress Management  If access to [facility] was not available, would you have sought care in the Emergency Department? No  Determination of Need Routine (7 days)  Options For Referral Medication Management;Inpatient Hospitalization
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
## 2023-01-12 NOTE — ED Notes (Signed)
Patient given urine cup for UDS. Patient spent 10 minutes in bathroom stating that she could not void. Will attempt again at a later time.

## 2023-01-12 NOTE — ED Notes (Signed)
The patient was given a sandwich and beverage.

## 2023-01-12 NOTE — ED Notes (Signed)
Patient filled urine cup with water. Unable to get urine sample at this time. While drawing labs, blood flow stopped, unable to obtain blood.
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
## 2023-01-13 ENCOUNTER — Inpatient Hospital Stay (HOSPITAL_COMMUNITY)
Admission: AD | Admit: 2023-01-13 | Discharge: 2023-01-28 | DRG: 885 | Disposition: A | Discharge status: Home / Self Care | Payer: Medicaid Other | Source: Other Acute Inpatient Hospital | Attending: Psychiatry | Admitting: Psychiatry

## 2023-01-13 DIAGNOSIS — Z91199 Patient's noncompliance with other medical treatment and regimen due to unspecified reason: Secondary | ICD-10-CM

## 2023-01-13 DIAGNOSIS — Z91148 Patient's other noncompliance with medication regimen for other reason: Secondary | ICD-10-CM

## 2023-01-13 DIAGNOSIS — Z8249 Family history of ischemic heart disease and other diseases of the circulatory system: Secondary | ICD-10-CM

## 2023-01-13 DIAGNOSIS — R410 Disorientation, unspecified: Secondary | ICD-10-CM | POA: Diagnosis present

## 2023-01-13 DIAGNOSIS — F202 Catatonic schizophrenia: Secondary | ICD-10-CM | POA: Diagnosis not present

## 2023-01-13 DIAGNOSIS — F1210 Cannabis abuse, uncomplicated: Secondary | ICD-10-CM | POA: Diagnosis present

## 2023-01-13 DIAGNOSIS — F209 Schizophrenia, unspecified: Secondary | ICD-10-CM | POA: Diagnosis present

## 2023-01-13 DIAGNOSIS — F259 Schizoaffective disorder, unspecified: Secondary | ICD-10-CM | POA: Diagnosis not present

## 2023-01-13 DIAGNOSIS — F1590 Other stimulant use, unspecified, uncomplicated: Secondary | ICD-10-CM | POA: Insufficient documentation

## 2023-01-13 DIAGNOSIS — F1510 Other stimulant abuse, uncomplicated: Secondary | ICD-10-CM | POA: Diagnosis present

## 2023-01-13 DIAGNOSIS — Z79899 Other long term (current) drug therapy: Secondary | ICD-10-CM

## 2023-01-13 DIAGNOSIS — F419 Anxiety disorder, unspecified: Secondary | ICD-10-CM | POA: Diagnosis present

## 2023-01-13 DIAGNOSIS — Z833 Family history of diabetes mellitus: Secondary | ICD-10-CM

## 2023-01-13 DIAGNOSIS — R45851 Suicidal ideations: Secondary | ICD-10-CM | POA: Diagnosis present

## 2023-01-13 DIAGNOSIS — Z5900 Homelessness unspecified: Secondary | ICD-10-CM | POA: Diagnosis not present

## 2023-01-13 DIAGNOSIS — F203 Undifferentiated schizophrenia: Secondary | ICD-10-CM | POA: Diagnosis not present

## 2023-01-13 LAB — COMPREHENSIVE METABOLIC PANEL
ALT: 38 U/L (ref 0–44)
AST: 26 U/L (ref 15–41)
Albumin: 2.9 g/dL — ABNORMAL LOW (ref 3.5–5.0)
Alkaline Phosphatase: 50 U/L (ref 38–126)
Anion gap: 9 (ref 5–15)
BUN: 6 mg/dL (ref 6–20)
CO2: 29 mmol/L (ref 22–32)
Calcium: 9.1 mg/dL (ref 8.9–10.3)
Chloride: 101 mmol/L (ref 98–111)
Creatinine, Ser: 0.74 mg/dL (ref 0.44–1.00)
GFR, Estimated: >60 mL/min (ref >60)
Glucose, Bld: 86 mg/dL (ref 70–99)
Potassium: 3.9 mmol/L (ref 3.5–5.1)
Sodium: 139 mmol/L (ref 135–145)
Total Bilirubin: 0.2 mg/dL — ABNORMAL LOW (ref 0.3–1.2)
Total Protein: 7.1 g/dL (ref 6.5–8.1)

## 2023-01-13 LAB — LIPID PANEL
Cholesterol: 107 mg/dL (ref 0–200)
HDL: 30 mg/dL — ABNORMAL LOW (ref >40)
LDL Cholesterol: 65 mg/dL (ref 0–99)
Total CHOL/HDL Ratio: 3.6 RATIO
Triglycerides: 61 mg/dL (ref <150)
VLDL: 12 mg/dL (ref 0–40)

## 2023-01-13 LAB — POCT URINE DRUG SCREEN - MANUAL ENTRY (I-SCREEN)
POC Amphetamine UR: POSITIVE — AB
POC Buprenorphine (BUP): NOT DETECTED
POC Cocaine UR: POSITIVE — AB
POC Marijuana UR: NOT DETECTED
POC Methadone UR: NOT DETECTED
POC Methamphetamine UR: NOT DETECTED
POC Morphine: NOT DETECTED
POC Oxazepam (BZO): NOT DETECTED
POC Oxycodone UR: NOT DETECTED
POC Secobarbital (BAR): NOT DETECTED

## 2023-01-13 LAB — URINALYSIS, ROUTINE W REFLEX MICROSCOPIC
Bilirubin Urine: NEGATIVE
Glucose, UA: NEGATIVE mg/dL
Hgb urine dipstick: NEGATIVE
Ketones, ur: NEGATIVE mg/dL
Nitrite: NEGATIVE
Protein, ur: NEGATIVE mg/dL
Specific Gravity, Urine: 1.024 (ref 1.005–1.030)
pH: 6 (ref 5.0–8.0)

## 2023-01-13 LAB — GC/CHLAMYDIA PROBE AMP (~~LOC~~) NOT AT ARMC
Chlamydia: NEGATIVE
Comment: NEGATIVE
Comment: NORMAL
Neisseria Gonorrhea: POSITIVE — AB

## 2023-01-13 LAB — RPR
RPR Ser Ql: REACTIVE — AB
RPR Titer: 1:2 {titer}

## 2023-01-13 LAB — MAGNESIUM: Magnesium: 2.2 mg/dL (ref 1.7–2.4)

## 2023-01-13 LAB — HIV ANTIBODY (ROUTINE TESTING W REFLEX): HIV Screen 4th Generation wRfx: NONREACTIVE

## 2023-01-13 LAB — HEMOGLOBIN A1C
Hgb A1c MFr Bld: 5.7 % — ABNORMAL HIGH (ref 4.8–5.6)
Mean Plasma Glucose: 117 mg/dL

## 2023-01-13 LAB — POCT PREGNANCY, URINE: Preg Test, Ur: NEGATIVE

## 2023-01-13 LAB — TSH: TSH: 0.746 u[IU]/mL (ref 0.350–4.500)

## 2023-01-13 MED ORDER — LORAZEPAM 2 MG/ML IJ SOLN: 2.0000 mg | Freq: Three times a day (TID) | INTRAMUSCULAR | Status: DC | PRN

## 2023-01-13 MED ORDER — HALOPERIDOL 5 MG PO TABS: 5.0000 mg | ORAL_TABLET | Freq: Three times a day (TID) | ORAL | Status: DC | PRN

## 2023-01-13 MED ORDER — HALOPERIDOL LACTATE 5 MG/ML IJ SOLN: 5.0000 mg | Freq: Three times a day (TID) | INTRAMUSCULAR | Status: DC | PRN

## 2023-01-13 MED ORDER — PALIPERIDONE ER 3 MG PO TB24
3.0000 mg | ORAL_TABLET | Freq: Every day | ORAL | Status: DC
  Administered 2023-01-13: 3 mg via ORAL
  Filled 2023-01-13: qty 1

## 2023-01-13 MED ORDER — TRAZODONE HCL 50 MG PO TABS
50.0000 mg | ORAL_TABLET | Freq: Every evening | ORAL | Status: DC | PRN
  Administered 2023-01-16 – 2023-01-18 (×3): 50 mg via ORAL
  Filled 2023-01-13 (×3): qty 1

## 2023-01-13 MED ORDER — PALIPERIDONE ER 3 MG PO TB24
3.0000 mg | ORAL_TABLET | Freq: Every day | ORAL | Status: DC
  Administered 2023-01-14: 3 mg via ORAL
  Filled 2023-01-13 (×4): qty 1

## 2023-01-13 MED ORDER — LORAZEPAM 1 MG PO TABS: 2.0000 mg | ORAL_TABLET | Freq: Three times a day (TID) | ORAL | Status: DC | PRN

## 2023-01-13 MED ORDER — DIPHENHYDRAMINE HCL 25 MG PO CAPS: 50.0000 mg | ORAL_CAPSULE | Freq: Three times a day (TID) | ORAL | Status: DC | PRN

## 2023-01-13 MED ORDER — MAGNESIUM HYDROXIDE 400 MG/5ML PO SUSP: 30.0000 mL | Freq: Every day | ORAL | Status: DC | PRN

## 2023-01-13 MED ORDER — ACETAMINOPHEN 325 MG PO TABS
650.0000 mg | ORAL_TABLET | Freq: Four times a day (QID) | ORAL | Status: DC | PRN
  Administered 2023-01-14 – 2023-01-28 (×2): 650 mg via ORAL
  Filled 2023-01-13 (×2): qty 2

## 2023-01-13 MED ORDER — HYDROXYZINE HCL 25 MG PO TABS
25.0000 mg | ORAL_TABLET | Freq: Three times a day (TID) | ORAL | Status: DC | PRN
  Administered 2023-01-14 – 2023-01-27 (×13): 25 mg via ORAL
  Filled 2023-01-13 (×13): qty 1

## 2023-01-13 MED ORDER — DIPHENHYDRAMINE HCL 50 MG/ML IJ SOLN: 50.0000 mg | Freq: Three times a day (TID) | INTRAMUSCULAR | Status: DC | PRN

## 2023-01-13 MED ORDER — ALUM & MAG HYDROXIDE-SIMETH 200-200-20 MG/5ML PO SUSP: 30.0000 mL | ORAL | Status: DC | PRN
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
## 2023-01-13 NOTE — Progress Notes (Signed)
Patient in bed  with eyes closed, no distress noted, all signs of life present. Will continue to monitor.

## 2023-01-13 NOTE — ED Notes (Signed)
[employer] called and stated that they would arrive in about 10 min. Informed patient.

## 2023-01-13 NOTE — ED Notes (Signed)
Discharge instructions provided and Pt stated understanding. Pt alert, orient and ambulatory prior to d/c from facility. Personal belongings returned from locker number 7. Pt escorted to the sally port where [employer] officers were waiting for transport to [facility]. [place] and IVC paperwork in envelope, given to officer. Safety maintained.

## 2023-01-13 NOTE — Progress Notes (Signed)
Pt was accepted to [facility] 01/13/23; Bed Assignment 506-1 PENDING IVC Paperwork and updated VS  -Per Leota Jacobsen, LPN IVC paperwork has been faxed to [phone]  Pt meets inpatient criteria per Sandi Carne, NP  Attending Physician will be Dr. Janine Limbo  Report can be called to: Adult unit: [phone]  Pt can arrive after 1400  Care Team notified: [facility] Lynnda Shields, RN, Leonia Reader, RN, Leota Jacobsen, LPN, Sandi Carne, NP, Sena Hitch, RN  Nadara Mode, LCSWA 01/13/2023 @ 11:30 AM

## 2023-01-13 NOTE — ED Notes (Signed)
Patient accepted PO meds w/o difficulty. Very sleepy this morning. Answered questions per this nurse. Denied SI/HI/AVS. Safety maintained and will continue to monitor.
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
## 2023-01-13 NOTE — ED Notes (Signed)
[employer] called for transportation services to [facility].

## 2023-01-13 NOTE — ED Notes (Signed)
IVC paperwork faxed and confirmation received. Three copies placed in envelope for transfer. Patient continues to rest on pull out bed/chair in no observed distress. Safety maintained.

## 2023-01-13 NOTE — ED Notes (Signed)
Report called to [employer] at [facility].

## 2023-01-13 NOTE — Progress Notes (Signed)
Adult Psychoeducational Group Note  Date:  01/13/2023 Time:  9:11 PM  Group Topic/Focus:  Wrap-Up Group:   The focus of this group is to help patients review their daily goal of treatment and discuss progress on daily workbooks.  Participation Level:  Did Not Attend  Participation Quality:  Did Not Attend  Affect:   Did Not Attend  Cognitive:   Did Not Attend  Insight: None  Engagement in Group:   Did Not Attend  Modes of Intervention:   Did Not Attend  Additional Comments:   Pt was encouraged to attend wrap up group but did not attend. Candy Sledge 01/13/2023, 9:11 PM

## 2023-01-13 NOTE — ED Notes (Signed)
Pt has been calm, resting quietly in flex area.

## 2023-01-14 ENCOUNTER — Encounter (HOSPITAL_COMMUNITY): Payer: Self-pay

## 2023-01-14 DIAGNOSIS — F203 Undifferentiated schizophrenia: Secondary | ICD-10-CM

## 2023-01-14 DIAGNOSIS — F1590 Other stimulant use, unspecified, uncomplicated: Secondary | ICD-10-CM | POA: Insufficient documentation

## 2023-01-14 DIAGNOSIS — F1210 Cannabis abuse, uncomplicated: Secondary | ICD-10-CM | POA: Insufficient documentation

## 2023-01-14 LAB — T.PALLIDUM AB, TOTAL: T Pallidum Abs: REACTIVE — AB

## 2023-01-14 LAB — PROLACTIN: Prolactin: 5.5 ng/mL (ref 4.8–33.4)

## 2023-01-14 MED ORDER — CEFTRIAXONE SODIUM 500 MG IJ SOLR
500.0000 mg | Freq: Once | INTRAMUSCULAR | Status: AC
  Administered 2023-01-14: 500 mg via INTRAMUSCULAR
  Filled 2023-01-14 (×2): qty 500

## 2023-01-14 MED ORDER — PALIPERIDONE ER 6 MG PO TB24
6.0000 mg | ORAL_TABLET | Freq: Every day | ORAL | Status: DC
  Administered 2023-01-15 – 2023-01-16 (×2): 6 mg via ORAL
  Filled 2023-01-14 (×4): qty 1

## 2023-01-14 NOTE — BH IP Treatment Plan (Signed)
Interdisciplinary Treatment and Diagnostic Plan Update  01/14/2023 Time of Session: 11:35 AM  Megan Monroe MRN: [id number]  Principal Diagnosis: Schizophrenia [facility])  Secondary Diagnoses: Principal Problem:   Schizophrenia ([place]) Active Problems:   Stimulant use disorder   Cannabis use disorder, mild, abuse   Current Medications:  Current Facility-Administered Medications  Medication Dose Route Frequency Provider Last Rate Last Admin   acetaminophen (TYLENOL) tablet 650 mg  650 mg Oral Q6H PRN Tharon Aquas, NP   650 mg at 01/14/23 0752   alum & mag hydroxide-simeth (MAALOX/MYLANTA) 200-200-20 MG/5ML suspension 30 mL  30 mL Oral Q4H PRN Tharon Aquas, NP       cefTRIAXone (ROCEPHIN) injection 500 mg  500 mg Intramuscular Once Winfred Leeds, Nadir, MD       diphenhydrAMINE (BENADRYL) capsule 50 mg  50 mg Oral TID PRN Tharon Aquas, NP       Or   diphenhydrAMINE (BENADRYL) injection 50 mg  50 mg Intramuscular TID PRN Tharon Aquas, NP       haloperidol (HALDOL) tablet 5 mg  5 mg Oral TID PRN Tharon Aquas, NP       Or   haloperidol lactate (HALDOL) injection 5 mg  5 mg Intramuscular TID PRN Tharon Aquas, NP       hydrOXYzine (ATARAX) tablet 25 mg  25 mg Oral TID PRN Tharon Aquas, NP   25 mg at 01/14/23 0556   LORazepam (ATIVAN) tablet 2 mg  2 mg Oral TID PRN Tharon Aquas, NP       Or   LORazepam (ATIVAN) injection 2 mg  2 mg Intramuscular TID PRN Tharon Aquas, NP       magnesium hydroxide (MILK OF MAGNESIA) suspension 30 mL  30 mL Oral Daily PRN Tharon Aquas, NP       Derrill Memo ON 01/15/2023] paliperidone (INVEGA) 24 hr tablet 6 mg  6 mg Oral Daily Attiah, Nadir, MD       traZODone (DESYREL) tablet 50 mg  50 mg Oral QHS PRN Tharon Aquas, NP       PTA Medications: No medications prior to admission.    Patient Stressors:    Patient Strengths:    Treatment Modalities: Medication Management, Group therapy, Case  management,  1 to 1 session with clinician, Psychoeducation, Recreational therapy.   Physician Treatment Plan for Primary Diagnosis: Schizophrenia ([place]) Long Term Goal(s): Improvement in symptoms so as ready for discharge   Short Term Goals: Ability to identify changes in lifestyle to reduce recurrence of condition will improve Ability to verbalize feelings will improve Ability to disclose and discuss suicidal ideas Ability to demonstrate self-control will improve  Medication Management: Evaluate patient's response, side effects, and tolerance of medication regimen.  Therapeutic Interventions: 1 to 1 sessions, Unit Group sessions and Medication administration.  Evaluation of Outcomes: Not Progressing  Physician Treatment Plan for Secondary Diagnosis: Principal Problem:   Schizophrenia ([place]) Active Problems:   Stimulant use disorder   Cannabis use disorder, mild, abuse  Long Term Goal(s): Improvement in symptoms so as ready for discharge   Short Term Goals: Ability to identify changes in lifestyle to reduce recurrence of condition will improve Ability to verbalize feelings will improve Ability to disclose and discuss suicidal ideas Ability to demonstrate self-control will improve     Medication Management: Evaluate patient's response, side effects, and tolerance of medication regimen.  Therapeutic Interventions: 1 to 1 sessions, Unit Group sessions and Medication administration.  Evaluation  of Outcomes: Not Progressing   RN Treatment Plan for Primary Diagnosis: Schizophrenia ([place]) Long Term Goal(s): Knowledge of disease and therapeutic regimen to maintain health will improve  Short Term Goals: Ability to remain free from injury will improve, Ability to verbalize frustration and anger appropriately will improve, Ability to demonstrate self-control, Ability to participate in decision making will improve, Ability to verbalize feelings will improve, Ability to disclose and discuss  suicidal ideas, Ability to identify and develop effective coping behaviors will improve, and Compliance with prescribed medications will improve  Medication Management: RN will administer medications as ordered by provider, will assess and evaluate patient's response and provide education to patient for prescribed medication. RN will report any adverse and/or side effects to prescribing provider.  Therapeutic Interventions: 1 on 1 counseling sessions, Psychoeducation, Medication administration, Evaluate responses to treatment, Monitor vital signs and CBGs as ordered, Perform/monitor CIWA, COWS, AIMS and Fall Risk screenings as ordered, Perform wound care treatments as ordered.  Evaluation of Outcomes: Not Progressing   LCSW Treatment Plan for Primary Diagnosis: Schizophrenia ([place]) Long Term Goal(s): Safe transition to appropriate next level of care at discharge, Engage patient in therapeutic group addressing interpersonal concerns.  Short Term Goals: Engage patient in aftercare planning with referrals and resources, Increase social support, Increase ability to appropriately verbalize feelings, Increase emotional regulation, Facilitate acceptance of mental health diagnosis and concerns, Facilitate patient progression through stages of change regarding substance use diagnoses and concerns, Identify triggers associated with mental health/substance abuse issues, and Increase skills for wellness and recovery  Therapeutic Interventions: Assess for all discharge needs, 1 to 1 time with Social worker, Explore available resources and support systems, Assess for adequacy in community support network, Educate family and significant other(s) on suicide prevention, Complete Psychosocial Assessment, Interpersonal group therapy.  Evaluation of Outcomes: Not Progressing   Progress in Treatment: Attending groups: No. Participating in groups: No. Taking medication as prescribed: Yes. Toleration medication:  Yes. Family/Significant other contact made: No, will contact:  Whoever pt gives CSW permission to speak with  Patient understands diagnosis: No. Discussing patient identified problems/goals with staff: No. Medical problems stabilized or resolved: Yes. Denies suicidal/homicidal ideation: Yes. Issues/concerns per patient self-inventory: No.  New problem(s) identified: No, Describe:  None reported   New Short Term/Long Term Goal(s): medication stabilization, elimination of SI thoughts, development of comprehensive mental wellness plan.    Patient Goals:  " I do not have none "   Discharge Plan or Barriers: Patient recently admitted. CSW will continue to follow and assess for appropriate referrals and possible discharge planning.    Reason for Continuation of Hospitalization: Delusions  Hallucinations Medication stabilization Withdrawal symptoms  Estimated Length of Stay: 5-7 days   Last 3 [place] Suicide Severity Risk Score: [place] ED from 01/12/2023 in [facility] ED from 01/08/2020 in [facility] Emergency Department at [facility] ED from 10/27/2019 in [facility] Emergency Department at [place] No Risk No Risk No Risk       Last [facility] 2/9 Scores:     No data to display          Scribe for Treatment Team: Charlett Lango 01/14/2023 2:11 PM

## 2023-01-14 NOTE — Progress Notes (Addendum)
    01/14/23 2057  Psych Admission Type (Psych Patients Only)  Admission Status Involuntary  Psychosocial Assessment  Patient Complaints None  Eye Contact Avoids  Facial Expression Flat  Affect Flat  Speech Logical/coherent  Interaction Minimal  Motor Activity Slow  Appearance/Hygiene In scrubs  Behavior Characteristics Cooperative;Appropriate to situation  Mood Preoccupied  Thought Process  Coherency WDL  Content WDL  Delusions None reported or observed  Perception WDL  Hallucination None reported or observed  Judgment Limited  Confusion None  Danger to Self  Current suicidal ideation? Denies  Danger to Others  Danger to Others None reported or observed   On assessment, patient is observed in bed, covered with a blanket, curled up and laying on her side.  Patient offered group, but selects not to attend.  Patient appears mildly anxious, and appears to be responding to internal stimuli.  Patient denies SI/HI.  Administered PRN Hydroxyzine per MAR per patient request.  Vitals obtained.  Patient is safe on the unit with q -15 minutes safety checks.

## 2023-01-14 NOTE — Progress Notes (Signed)
Did not attended group 

## 2023-01-14 NOTE — Plan of Care (Signed)
  Problem: Education: Goal: Knowledge of General Education information will improve Description: Including pain rating scale, medication(s)/side effects and non-pharmacologic comfort measures Outcome: Progressing   Problem: Nutrition: Goal: Adequate nutrition will be maintained Outcome: Progressing   

## 2023-01-14 NOTE — BHH Suicide Risk Assessment (Signed)
[facility] Admission Suicide Risk Assessment   Nursing information obtained from:    Demographic factors:    Current Mental Status:    Loss Factors:    Historical Factors:    Risk Reduction Factors:     Total Time spent with patient: 1 hour Principal Problem: Schizophrenia ([place]) Diagnosis:  Principal Problem:   Schizophrenia ([place])  Subjective Data: see H&P  Continued Clinical Symptoms:    The "Alcohol Use Disorders Identification Test", Guidelines for Use in Primary Care, Second Edition.  World Pharmacologist [facility]). Score between 0-7:  no or low risk or alcohol related problems. Score between 8-15:  moderate risk of alcohol related problems. Score between 16-19:  high risk of alcohol related problems. Score 20 or above:  warrants further diagnostic evaluation for alcohol dependence and treatment.   CLINICAL FACTORS:   Alcohol/Substance Abuse/Dependencies Schizophrenia:   Paranoid or undifferentiated type   Musculoskeletal: Strength & Muscle Tone: within normal limits Gait & Station: normal Patient leans: N/A  Psychiatric Specialty Exam:  Presentation  General Appearance:  Disheveled  Eye Contact: None  Speech: Other (comment) (minimally engaged)  Speech Volume: Other (comment) (minimally engaged)  Handedness: Right   Mood and Affect  Mood: Dysphoric; Depressed  Affect: Flat   Thought Process  Thought Processes: Disorganized  Descriptions of Associations:-- (minimally engaed)  Orientation:Other (comment) (minimally engaged)  Thought Content:Other (comment) (minimally engaed)  History of Schizophrenia/Schizoaffective disorder:Yes  Duration of Psychotic Symptoms:Greater than six months  Hallucinations:Hallucinations: Other (comment) (minimally engaged)  Ideas of Reference:Other (comment) (minimally engaged)  Suicidal Thoughts:Suicidal Thoughts: No  Homicidal Thoughts:Homicidal Thoughts: No   Sensorium  Memory: Other (comment)  (minimally engaged)  Judgment: Poor  Insight: Lacking   Executive Functions  Concentration: Poor  Attention Span: Poor  Recall: Poor  Fund of Knowledge: Poor  Language: Poor   Psychomotor Activity  Psychomotor Activity: Psychomotor Activity: Normal   Assets  Assets: Financial Resources/Insurance; Resilience   Sleep  Sleep: Sleep: Poor    Physical Exam: Physical Exam ROS Blood pressure (!) 106/59, pulse (!) 116, temperature 99.2 F (37.3 C), temperature source Oral, resp. rate 16, height 5\' 3"  (1.6 m), weight 50.3 kg, SpO2 100 %. Body mass index is 19.66 kg/m.   COGNITIVE FEATURES THAT CONTRIBUTE TO RISK:  None    SUICIDE RISK:   Moderate:  Frequent suicidal ideation with limited intensity, and duration, some specificity in terms of plans, no associated intent, good self-control, limited dysphoria/symptomatology, some risk factors present, and identifiable protective factors, including available and accessible social support.  PLAN OF CARE: see H&P  I certify that inpatient services furnished can reasonably be expected to improve the patient's condition.   Harless Molinari Winfred Leeds, MD 01/14/2023, 11:50 AM

## 2023-01-14 NOTE — BHH Group Notes (Signed)
Patient did not attend the Wrap-up group. 

## 2023-01-14 NOTE — H&P (Signed)
Psychiatric Admission Assessment Adult  Patient Identification: Megan Monroe MRN:  [id number] Date of Evaluation:  01/14/2023  Chief Complaint:  Schizophrenia ([place]) [F20.9]  History of Present Illness:  Megan Monroe is a 27 y.o., female with a past psychiatric history significant for schizophrenia, polysubstance use who presents to the [facility] from urgent care for evaluation and management of increased psychosis.  According to outside records, the patient presented to urgent care with police under IVC.  Collateral information obtained from patient's grandmother patient has diagnosis of schizophrenia noncompliant with her medications made suicidal statement to her grandmother that she want to die.  Initial assessment on 01/14/2023, patient was evaluated on the inpatient unit, the patient presents disorganized responding to stimuli unable to answer questions in linear meaningful reliable manner.  Most history information noted below were obtained from chart review for collateral information obtained from grandmother. Patient answers most of the questions "I guess so" "I have no idea" Patient unable to answer simple social history questions for example when asking if she has children she responds "whole lot" when asking how many she responds "I do not know" also she tells me that she does not use any illicit drugs but when confronted with results she responds "I do not" she primarily denies auditory or visual hallucination but with further questioning she does admit to hearing voices of God and seeing God but unable to provide any further details she does admit to feeling paranoid from others but when asked to elaborate more details she responds "I do not know I have no idea" when asking if she has special powers she responds "yes" but unable to provide details.  Chart review: Patient with diagnosis of schizophrenia was admitted to [facility] in 2021 and a partner from March  to June 2023, history of being on [place] injection few months ago but questionable response.  Notes from urgent care were reviewed indicating grandmother and family noted concerns of patient is involved in human trafficking given that she disappears for long peers of time with multiple missing person reports may in the past.  Psych meds prior to admission:  History of being on [place] but no psych medications noted prior to admission  Sleep Sleep:Sleep: Poor   Collateral information: Collateral information was obtained from patient grandmother at urgent care.  Past Psychiatric History:  Prior Psychiatric diagnoses: Schizophrenia Past Psychiatric Hospitalizations: [facility] 2021 and [facility]  History of self mutilation: None noted Past suicide attempts: None noted, patient denies Past history of HI, violent or aggressive behavior: None noted  Past Psychiatric medications trials: Kirt Boys as noted above but noncompliant History of ECT/TMS: None noted  Outpatient psychiatric Follow up: Noncompliant Prior Outpatient Therapy: Unknown    Is the patient at risk to self? Yes.    Has the patient been a risk to self in the past 6 months? No.  Has the patient been a risk to self within the distant past? No.  Is the patient a risk to others? No.  Has the patient been a risk to others in the past 6 months? No.  Has the patient been a risk to others within the distant past? No.    Substance Use History: Alcohol: Denies Tobacoo: Denies Marijuana: Denies but tested positive Cocaine: Denies but tested positive Stimulants: Denies but tested positive IV drug use: Denies Opiates: Denies Prescribed Meds abuse: Denies H/O withdrawals, blackouts, DTs: Denies History of Detox / Rehab: Denies DUI: Denies  Alcohol Screening:  Substance Abuse History in the last 12 months:  Yes.     Tobacco Screening:     Past Medical/Surgical History:  Past  Medical History:  Diagnosis Date   Exercise-induced asthma    No past surgical history on file.  Family History:  Family History  Problem Relation Age of Onset   Hypertension Father    Diabetes Maternal Grandmother    Heart attack Cousin        Early 23s    Family Psychiatric History:  Psychiatric illness: Unknown Suicide: Unknown Substance Abuse: Unknown  Social History:  Social History   Substance and Sexual Activity  Alcohol Use No     Social History   Substance and Sexual Activity  Drug Use No    Living situation: Unknown, per chart review patient is homeless Social support: Limited Marital Status: Patient reports she was married multiple times but unreliable Children: "Whole lot" unreliable historian Education: Unknown Employment: Adult nurse service: Unknown Legal history: Patient denies but unreliable Trauma: Unknown Access to guns: Unknown   Allergies:  No Known Allergies  Lab Results:  Results for orders placed or performed during the hospital encounter of 01/12/23 (from the past 48 hour(s))  Resp panel by RT-PCR (RSV, Flu A&B, Covid) Anterior Nasal Swab     Status: None   Collection Time: 01/12/23  6:27 PM   Specimen: Anterior Nasal Swab  Result Value Ref Range   SARS Coronavirus 2 by RT PCR NEGATIVE NEGATIVE   Influenza A by PCR NEGATIVE NEGATIVE   Influenza B by PCR NEGATIVE NEGATIVE    Comment: (NOTE) The Xpert Xpress SARS-CoV-2/FLU/RSV plus assay is intended as an aid in the diagnosis of influenza from Nasopharyngeal swab specimens and should not be used as a sole basis for treatment. Nasal washings and aspirates are unacceptable for Xpert Xpress SARS-CoV-2/FLU/RSV testing.  Fact Sheet for Patients: EntrepreneurPulse.com.au  Fact Sheet for Healthcare Providers: IncredibleEmployment.be  This test is not yet approved or cleared by the [place] FDA and has been authorized for detection and/or  diagnosis of SARS-CoV-2 by FDA under an Emergency Use Authorization (EUA). This EUA will remain in effect (meaning this test can be used) for the duration of the COVID-19 declaration under Section 564(b)(1) of the Act, 21 U.S.C. section 360bbb-3(b)(1), unless the authorization is terminated or revoked.     Resp Syncytial Virus by PCR NEGATIVE NEGATIVE    Comment: (NOTE) Fact Sheet for Patients: EntrepreneurPulse.com.au  Fact Sheet for Healthcare Providers: IncredibleEmployment.be  This test is not yet approved or cleared by the [place] FDA and has been authorized for detection and/or diagnosis of SARS-CoV-2 by FDA under an Emergency Use Authorization (EUA). This EUA will remain in effect (meaning this test can be used) for the duration of the COVID-19 declaration under Section 564(b)(1) of the Act, 21 U.S.C. section 360bbb-3(b)(1), unless the authorization is terminated or revoked.  Performed at [facility], [place] [address], [place], [place] [address]   POC SARS Coronavirus 2 Ag     Status: None   Collection Time: 01/12/23  6:33 PM  Result Value Ref Range   SARSCOV2ONAVIRUS 2 AG NEGATIVE NEGATIVE    Comment: (NOTE) SARS-CoV-2 antigen NOT DETECTED.   Negative results are presumptive.  Negative results do not preclude SARS-CoV-2 infection and should not be used as the sole basis for treatment or other patient management decisions, including infection  control decisions, particularly in the presence of clinical signs and  symptoms consistent with COVID-19, or in  those who have been in contact with the virus.  Negative results must be combined with clinical observations, patient history, and epidemiological information. The expected result is Negative.  Fact Sheet for Patients: HandmadeRecipes.com.cy  Fact Sheet for Healthcare Providers: FuneralLife.at  This test is not yet  approved or cleared by the [place] FDA and  has been authorized for detection and/or diagnosis of SARS-CoV-2 by FDA under an Emergency Use Authorization (EUA).  This EUA will remain in effect (meaning this test can be used) for the duration of  the COV ID-19 declaration under Section 564(b)(1) of the Act, 21 U.S.C. section 360bbb-3(b)(1), unless the authorization is terminated or revoked sooner.    CBC with Differential/Platelet     Status: Abnormal   Collection Time: 01/12/23 10:17 PM  Result Value Ref Range   WBC 8.6 4.0 - 10.5 K/uL   RBC 4.52 3.87 - 5.11 MIL/uL   Hemoglobin 11.4 (L) 12.0 - 15.0 g/dL   HCT 36.0 36.0 - 46.0 %   MCV 79.6 (L) 80.0 - 100.0 fL   MCH 25.2 (L) 26.0 - 34.0 pg   MCHC 31.7 30.0 - 36.0 g/dL   RDW 15.7 (H) 11.5 - 15.5 %   Platelets 678 (H) 150 - 400 K/uL   nRBC 0.0 0.0 - 0.2 %   Neutrophils Relative % 61 %   Neutro Abs 5.4 1.7 - 7.7 K/uL   Lymphocytes Relative 29 %   Lymphs Abs 2.5 0.7 - 4.0 K/uL   Monocytes Relative 6 %   Monocytes Absolute 0.5 0.1 - 1.0 K/uL   Eosinophils Relative 2 %   Eosinophils Absolute 0.2 0.0 - 0.5 K/uL   Basophils Relative 1 %   Basophils Absolute 0.0 0.0 - 0.1 K/uL   Immature Granulocytes 1 %   Abs Immature Granulocytes 0.08 (H) 0.00 - 0.07 K/uL    Comment: Performed at [facility], [address], [place], [place] [address]  Comprehensive metabolic panel     Status: Abnormal   Collection Time: 01/12/23 10:17 PM  Result Value Ref Range   Sodium 139 135 - 145 mmol/L   Potassium 3.9 3.5 - 5.1 mmol/L   Chloride 101 98 - 111 mmol/L   CO2 29 22 - 32 mmol/L   Glucose, Bld 86 70 - 99 mg/dL    Comment: Glucose reference range applies only to samples taken after fasting for at least 8 hours.   BUN 6 6 - 20 mg/dL   Creatinine, Ser 0.74 0.44 - 1.00 mg/dL   Calcium 9.1 8.9 - 10.3 mg/dL   Total Protein 7.1 6.5 - 8.1 g/dL   Albumin 2.9 (L) 3.5 - 5.0 g/dL   AST 26 15 - 41 U/L   ALT 38 0 - 44 U/L   Alkaline Phosphatase  50 38 - 126 U/L   Total Bilirubin 0.2 (L) 0.3 - 1.2 mg/dL   GFR, Estimated >60 >60 mL/min    Comment: (NOTE) Calculated using the CKD-EPI Creatinine Equation (2021)    Anion gap 9 5 - 15    Comment: Performed at [facility] [address], [place], [place] [address]  Hemoglobin A1c     Status: Abnormal   Collection Time: 01/12/23 10:17 PM  Result Value Ref Range   Hgb A1c MFr Bld 5.7 (H) 4.8 - 5.6 %    Comment: (NOTE)         Prediabetes: 5.7 - 6.4         Diabetes: >6.4  Glycemic control for adults with diabetes: <7.0    Mean Plasma Glucose 117 mg/dL    Comment: (NOTE) Performed At: [facility] [place], [place] [id number] Rush Farmer MD [id number]   Magnesium     Status: None   Collection Time: 01/12/23 10:17 PM  Result Value Ref Range   Magnesium 2.2 1.7 - 2.4 mg/dL    Comment: Performed at [facility] [address], [place], [place] [address]  Ethanol     Status: None   Collection Time: 01/12/23 10:17 PM  Result Value Ref Range   Alcohol, Ethyl (B) <10 <10 mg/dL    Comment: (NOTE) Lowest detectable limit for serum alcohol is 10 mg/dL.  For medical purposes only. Performed at [facility], [place] [address], [place], [place] [address]   Lipid panel     Status: Abnormal   Collection Time: 01/12/23 10:17 PM  Result Value Ref Range   Cholesterol 107 0 - 200 mg/dL   Triglycerides 61 <150 mg/dL   HDL 30 (L) >40 mg/dL   Total CHOL/HDL Ratio 3.6 RATIO   VLDL 12 0 - 40 mg/dL   LDL Cholesterol 65 0 - 99 mg/dL    Comment:        Total Cholesterol/HDL:CHD Risk Coronary Heart Disease Risk Table                     Men   Women  1/2 Average Risk   3.4   3.3  Average Risk       5.0   4.4  2 X Average Risk   9.6   7.1  3 X Average Risk  23.4   11.0        Use the calculated Patient Ratio above and the CHD Risk Table to determine the patient's CHD Risk.        ATP III CLASSIFICATION (LDL):  <100     mg/dL    Optimal  100-129  mg/dL   Near or Above                    Optimal  130-159  mg/dL   Borderline  160-189  mg/dL   High  >190     mg/dL   Very High Performed at [facility] [address], [place], [place] [address]   TSH     Status: None   Collection Time: 01/12/23 10:17 PM  Result Value Ref Range   TSH 0.746 0.350 - 4.500 uIU/mL    Comment: Performed by a 3rd Generation assay with a functional sensitivity of <=0.01 uIU/mL. Performed at [facility], [place] [address] [place], [place] [address]   Prolactin     Status: None   Collection Time: 01/12/23 10:17 PM  Result Value Ref Range   Prolactin 5.5 4.8 - 33.4 ng/mL    Comment: (NOTE) Performed At: [facility] [place], [place] [id number] Rush Farmer MD [id number]   RPR     Status: Abnormal   Collection Time: 01/12/23 10:17 PM  Result Value Ref Range   RPR Ser Ql Reactive (A) NON REACTIVE    Comment: SENT FOR CONFIRMATION   RPR Titer 1:2     Comment: Performed at [facility], [place] [address], [place], [place] [address]  HIV Antibody (routine testing w rflx)     Status: None   Collection Time: 01/12/23 10:17 PM  Result Value Ref  Range   HIV Screen 4th Generation wRfx Non Reactive Non Reactive    Comment: Performed at [facility], [place] [address] [place], [place] [address]  POCT Urine Drug Screen - (I-Screen)     Status: Abnormal   Collection Time: 01/13/23 12:43 AM  Result Value Ref Range   POC Amphetamine UR Positive (A) NONE DETECTED (Cut Off Level 1000 ng/mL)   POC Secobarbital (BAR) None Detected NONE DETECTED (Cut Off Level 300 ng/mL)   POC Buprenorphine (BUP) None Detected NONE DETECTED (Cut Off Level 10 ng/mL)   POC Oxazepam (BZO) None Detected NONE DETECTED (Cut Off Level 300 ng/mL)   POC Cocaine UR Positive (A) NONE DETECTED (Cut Off Level 300 ng/mL)   POC Methamphetamine UR None Detected NONE DETECTED (Cut Off Level 1000 ng/mL)   POC Morphine None Detected NONE  DETECTED (Cut Off Level 300 ng/mL)   POC Methadone UR None Detected NONE DETECTED (Cut Off Level 300 ng/mL)   POC Oxycodone UR None Detected NONE DETECTED (Cut Off Level 100 ng/mL)   POC Marijuana UR None Detected NONE DETECTED (Cut Off Level 50 ng/mL)  GC/Chlamydia probe amp ([place])not at [facility]     Status: Abnormal   Collection Time: 01/13/23 12:47 AM  Result Value Ref Range   Neisseria Gonorrhea Positive (A)    Chlamydia Negative    Comment Normal Reference [place] Chlamydia - Negative    Comment      Normal Reference Range Neisseria Gonorrhea - Negative  Urinalysis, Routine w reflex microscopic -Urine, Clean Catch     Status: Abnormal   Collection Time: 01/13/23 12:51 AM  Result Value Ref Range   Color, Urine YELLOW YELLOW   APPearance HAZY (A) CLEAR   Specific Gravity, Urine 1.024 1.005 - 1.030   pH 6.0 5.0 - 8.0   Glucose, UA NEGATIVE NEGATIVE mg/dL   Hgb urine dipstick NEGATIVE NEGATIVE   Bilirubin Urine NEGATIVE NEGATIVE   Ketones, ur NEGATIVE NEGATIVE mg/dL   Protein, ur NEGATIVE NEGATIVE mg/dL   Nitrite NEGATIVE NEGATIVE   Leukocytes,Ua MODERATE (A) NEGATIVE   RBC / HPF 11-20 0 - 5 RBC/hpf   WBC, UA 11-20 0 - 5 WBC/hpf   Bacteria, UA RARE (A) NONE SEEN   Squamous Epithelial / HPF 6-10 0 - 5 /HPF   Mucus PRESENT     Comment: Performed at [facility], [address], [place],  [address]  Pregnancy, urine POC     Status: None   Collection Time: 01/13/23  1:03 AM  Result Value Ref Range   Preg Test, Ur NEGATIVE NEGATIVE    Comment:        THE SENSITIVITY OF THIS METHODOLOGY IS >24 mIU/mL     Blood Alcohol level:  Lab Results  Component Value Date   ETH <10 01/12/2023   ETH <10 99991111    Metabolic Disorder Labs:  Lab Results  Component Value Date   HGBA1C 5.7 (H) 01/12/2023   MPG 117 01/12/2023   Lab Results  Component Value Date   PROLACTIN 5.5 01/12/2023   Lab Results  Component Value Date   CHOL 107 01/12/2023   TRIG 61  01/12/2023   HDL 30 (L) 01/12/2023   CHOLHDL 3.6 01/12/2023   VLDL 12 01/12/2023   LDLCALC 65 01/12/2023   LDLCALC 107 08/13/2012    Current Medications: Current Facility-Administered Medications  Medication Dose Route Frequency Provider Last Rate Last Admin   acetaminophen (TYLENOL) tablet 650 mg  650 mg  Oral Q6H PRN Tharon Aquas, NP   650 mg at 01/14/23 0752   alum & mag hydroxide-simeth (MAALOX/MYLANTA) 200-200-20 MG/5ML suspension 30 mL  30 mL Oral Q4H PRN Tharon Aquas, NP       diphenhydrAMINE (BENADRYL) capsule 50 mg  50 mg Oral TID PRN Tharon Aquas, NP       Or   diphenhydrAMINE (BENADRYL) injection 50 mg  50 mg Intramuscular TID PRN Tharon Aquas, NP       haloperidol (HALDOL) tablet 5 mg  5 mg Oral TID PRN Tharon Aquas, NP       Or   haloperidol lactate (HALDOL) injection 5 mg  5 mg Intramuscular TID PRN Tharon Aquas, NP       hydrOXYzine (ATARAX) tablet 25 mg  25 mg Oral TID PRN Tharon Aquas, NP   25 mg at 01/14/23 0556   LORazepam (ATIVAN) tablet 2 mg  2 mg Oral TID PRN Tharon Aquas, NP       Or   LORazepam (ATIVAN) injection 2 mg  2 mg Intramuscular TID PRN Tharon Aquas, NP       magnesium hydroxide (MILK OF MAGNESIA) suspension 30 mL  30 mL Oral Daily PRN Tharon Aquas, NP       paliperidone (INVEGA) 24 hr tablet 3 mg  3 mg Oral Daily Tharon Aquas, NP   3 mg at 01/14/23 [id number]   traZODone (DESYREL) tablet 50 mg  50 mg Oral QHS PRN Tharon Aquas, NP        PTA Medications: No medications prior to admission.    Musculoskeletal: Strength & Muscle Tone: within normal limits Gait & Station: normal Patient leans: N/A   Physical Findings: AIMS:  , ,  ,  ,    CIWA:    COWS:     Psychiatric Specialty Exam:  General Appearance: Appears at stated age, dressed in hospital gown, unkempt and disheveled  Behavior: Guarded  Psychomotor Activity: Mild to moderate psychomotor retardation  noted  Eye Contact: Poor Speech: Decreased amount, decreased tone and volume   Mood: Euthymic Affect: Incongruent, disorganized  Thought Process: Disorganized, thought blocking noted Descriptions of Associations: Disorganized Thought Content: Hallucinations: Admits to AH, VH, appears responding to stimuli Delusions: Paranoid and delusional Suicidal Thoughts: Denies SI, intention, plan  Homicidal Thoughts: Denies HI, intention, plan   Alertness/Orientation: Alert but disoriented to time place or situation probably secondary to psychosis  Insight: poor Judgment: poor  Memory: Poor  Community education officer  Concentration: Poor Attention Span: Poor Recall: poor Fund of Knowledge: poor   Physical Exam:  Physical Exam Vitals and nursing note reviewed.  Constitutional:      Appearance: Normal appearance.  HENT:     Head: Normocephalic and atraumatic.  Pulmonary:     Effort: Pulmonary effort is normal.  Musculoskeletal:        General: Normal range of motion.     Cervical back: Normal range of motion.  Neurological:     Mental Status: She is alert.    Review of Systems  Reason unable to perform ROS: Unable to assess, patient is disorganized.   Blood pressure (!) 106/59, pulse (!) 116, temperature 99.2 F (37.3 C), temperature source Oral, resp. rate 16, height 5\' 3"  (1.6 m), weight 50.3 kg, SpO2 100 %. Body mass index is 19.66 kg/m.   Assets  Assets:Financial Resources/Insurance; Resilience    Treatment Plan Summary: Daily contact with patient to assess and  evaluate symptoms and progress in treatment and Medication management  ASSESSMENT:  Principal Diagnosis: Schizophrenia ([place]) Diagnosis:  Principal Problem:   Schizophrenia ([place])   PLAN: Safety and Monitoring:  -- Involuntary admission to inpatient psychiatric unit for safety, stabilization and treatment  -- Daily contact with patient to assess and evaluate symptoms and progress in treatment  --  Patient's case to be discussed in multi-disciplinary team meeting  -- Observation Level : q15 minute checks  -- Vital signs:  q12 hours  -- Precautions: suicide, elopement, and assault  2. Medications:   Patient was started on Invega 3 mg daily at time of admission  Titrate Invega to 6 mg daily to target mood and psychosis  Consider starting Kirt Boys if responds well and tolerates Invega orally to help with long-term compliance and decrease risk of decompensation  As needed protocol for agitation and aggression  Atarax 25 mg 3 times daily as needed for anxiety  Trazodone 50 mg at bedtime as needed for sleep  Ceftriaxone 500 mg IM once for gonorrhea/STD    The risks/benefits/side-effects/alternatives to this medication were discussed in detail with the patient and time was given for questions. The patient consents to medication trial.    -- Metabolic profile and EKG monitoring obtained while on an atypical antipsychotic (BMI: Lipid Panel: HbgA1c: QTc:)      3. Labs Reviewed: CMP no significant abnormalities noted, CBC no significant abnormalities noted, lipid profile no significant abnormalities noted, pregnancy test negative, hemoglobin A1c 5.7, TSH within normal limit, RPR reactive but sent for confirmatory testing, gonorrhea positive, chlamydia negative, UDS positive to amphetamine, cocaine and marijuana, EKG 3/18 NSR QTc 439      Lab ordered: Confirmatory testing RPR, HIV  4. Group and Therapy: -- Encouraged patient to participate in unit milieu and in scheduled group therapies   --Substance Use counseling: As patient clears cognitively will counsel regarding need to abstain completely from illicit drug use after discharge  5. Discharge Planning:   -- Social work and case management to assist with discharge planning and identification of hospital follow-up needs prior to discharge  -- Estimated LOS: 5-7 days  -- Discharge Concerns: Need to establish a safety plan; Medication  compliance and effectiveness  -- Discharge Goals: Return home with outpatient referrals for mental health follow-up including medication management/psychotherapy   The patient is agreeable with the medication plan, as above. We will monitor the patient's response to pharmacologic treatment, and adjust medications as necessary. Patient is encouraged to participate in group therapy while admitted to the psychiatric unit. We will address other chronic and acute stressors, which contributed to the patient's increased psychosis, in order to reduce the risk of self-harm at discharge.   Physician Treatment Plan for Primary Diagnosis: Schizophrenia ([place]) Long Term Goal(s): Improvement in symptoms so as ready for discharge  Short Term Goals: Ability to identify changes in lifestyle to reduce recurrence of condition will improve, Ability to verbalize feelings will improve, Ability to disclose and discuss suicidal ideas, and Ability to demonstrate self-control will improve   I certify that inpatient services furnished can reasonably be expected to improve the patient's condition.    Total Time Spent in Direct Patient Care:  I personally spent 55 minutes on the unit in direct patient care. The direct patient care time included face-to-face time with the patient, reviewing the patient's chart, communicating with other professionals, and coordinating care. Greater than 50% of this time was spent in counseling or coordinating care with the patient regarding  goals of hospitalization, psycho-education, and discharge planning needs.     Kurstin Dimarzo Winfred Leeds, MD 3/20/202412:54 PM

## 2023-01-14 NOTE — Group Note (Signed)
Recreation Therapy Group Note   Group Topic:Team Building  Group Date: 01/14/2023 Start Time: [id number] End Time: [id number] Facilitators: Spiro Ausborn-McCall, LRT,CTRS Location: [address] Dayroom   Goal Area(s) Addresses:  Patient will effectively work with peer towards shared goal.  Patient will identify skills used to make activity successful.  Patient will identify how skills used during activity can be used to reach post d/c goals.    Group Description: Landing Pad. In teams of 3-5, patients were given 12 plastic drinking straws and an equal length of masking tape. Using the materials provided, patients were asked to build a landing pad to catch a golf ball dropped from approximately 5 feet in the air. All materials were required to be used by the team in their design. LRT facilitated post-activity discussion.   Affect/Mood: N/A   Participation Level: Did not attend    Clinical Observations/Individualized Feedback:      Plan: Continue to engage patient in RT group sessions 2-3x/week.   Alaze Garverick-McCall, LRT,CTRS  01/14/2023 11:59 AM

## 2023-01-14 NOTE — Progress Notes (Signed)
   01/14/23 1307  Psych Admission Type (Psych Patients Only)  Admission Status Involuntary  Psychosocial Assessment  Patient Complaints None  Eye Contact Avoids  Facial Expression Flat  Affect Flat  Speech Logical/coherent  Interaction Minimal  Motor Activity Slow  Appearance/Hygiene In scrubs  Behavior Characteristics Cooperative;Appropriate to situation  Mood Preoccupied  Thought Process  Coherency WDL  Content WDL  Delusions None reported or observed  Perception WDL  Hallucination None reported or observed  Judgment Limited  Confusion None  Danger to Self  Current suicidal ideation? Denies  Danger to Others  Danger to Others None reported or observed

## 2023-01-14 NOTE — Progress Notes (Signed)
Patient responding heavily to internal stimuli. Having conversations with herself, writing on the walls with her finger. Patient had to be prompted to shower. Strong urine odor. Hair is matted.

## 2023-01-15 DIAGNOSIS — F209 Schizophrenia, unspecified: Secondary | ICD-10-CM | POA: Diagnosis not present

## 2023-01-15 LAB — HIV ANTIBODY (ROUTINE TESTING W REFLEX): HIV Screen 4th Generation wRfx: NONREACTIVE

## 2023-01-15 NOTE — Group Note (Signed)
Recreation Therapy Group Note   Group Topic:Leisure Education  Group Date: 01/15/2023 Start Time: 1000 End Time: 1042 Facilitators: Edon Hoadley-McCall, LRT,CTRS Location: [address] Dayroom   Goal Area(s) Addresses:  Patient will successfully identify positive leisure and recreation activities.  Patient will acknowledge benefits of participation in healthy leisure activities post discharge.  Patient will actively work with peers toward a shared goal.   Group Description: Pictionary. In groups of 5-7, patients took turns trying to guess the picture being drawn on the board by their teammate.  If the team guessed the correct answer, they won a point.  If the team guessed wrong, the other team got a chance to steal the point. After several rounds of game play, the team with the most points were declared winners. Post-activity discussion reviewed benefits of positive recreation outlets: reducing stress, improving coping mechanisms, increasing self-esteem, and building larger support systems.   Affect/Mood: N/A   Participation Level: Did not attend    Clinical Observations/Individualized Feedback:      Plan: Continue to engage patient in RT group sessions 2-3x/week.   Alain Deschene-McCall, LRT,CTRS 01/15/2023 12:12 PM

## 2023-01-15 NOTE — Group Note (Signed)
Date:  01/15/2023 Time:  11:41 PM  Group Topic/Focus:  Wrap-Up Group:   The focus of this group is to help patients review their daily goal of treatment and discuss progress on daily workbooks.    Participation Level:  Did Not Attend  Participation Qu3/21/2024, 11:41 PM

## 2023-01-15 NOTE — Progress Notes (Signed)
    01/15/23 0530  15 Minute Checks  Location Bedroom  Visual Appearance Calm  Behavior Sleeping  Sleep (Behavioral Health Patients Only)  Calculate sleep? (Click Yes once per 24 hr at 0600 safety check) Yes  Documented sleep last 24 hours 7.5

## 2023-01-15 NOTE — Progress Notes (Signed)
Recreation Therapy Notes  INPATIENT RECREATION THERAPY ASSESSMENT  Patient Details Name: Megan Monroe MRN: [id number] DOB: 08-26-1996 Today's Date: 01/15/2023       Information Obtained From: Patient  Able to Participate in Assessment/Interview: Yes (Pt would giggle throughout assessment.  Pt didn't seem to be invested in assessment.)  Patient Presentation: Alert  Reason for Admission (Per Patient): Other (Comments) ("I have no idea")  Patient Stressors:  (None)  Coping Skills:   Sports, TV, Meditate, Talk, Prayer, Avoidance  Leisure Interests (2+):   ("I have no idea")  Frequency of Recreation/Participation:  (N/A)  Awareness of Community Resources:   ("I guess")  Expressed Interest in [place]:  (N/A)  County of Residence:  Pt couldn't identify; per chart: [facility]  Patient Main Form of Transportation:  ("No idea")  Patient Strengths:  "I don't know"  Patient Identified Areas of Improvement:  No  Patient Goal for Hospitalization:  "I don't know"  Current SI (including self-harm):  No  Current HI:  No  Current AVH: No  Staff Intervention Plan: Group Attendance, Collaborate with Interdisciplinary Treatment Team  Consent to Intern Participation: N/A   Iesha Summerhill-McCall, LRT,CTRS Tyee Vandevoorde A Gamal Todisco-McCall 01/15/2023, 12:46 PM

## 2023-01-15 NOTE — Group Note (Unsigned)
Date:  01/15/2023 Time:  10:20 PM  Group Topic/Focus:  Wrap-Up Group:   The focus of this group is to help patients review their daily goal of treatment and discuss progress on daily workbooks.     Participation Level:  {BHH PARTICIPATION WO:[id number]  Participation Quality:  {BHH PARTICIPATION QUALITY:22265}  Affect:  {BHH AFFECT:22266}  Cognitive:  {BHH COGNITIVE:22267}  Insight: {BHH Insight2:20797}  Engagement in Group:  {BHH ENGAGEMENT IN BP:8198245  Modes of Intervention:  {BHH MODES OF INTERVENTION:22269}  Additional Comments:  ***  Debe Coder 01/15/2023, 10:20 PM

## 2023-01-15 NOTE — Progress Notes (Signed)
[facility] MD Progress Note  01/15/2023 1:14 PM Megan Monroe  MRN:  [id number] Subjective:   In brief; Megan Monroe is a 27 year old female with history for schizophrenia, polysubstance use who presented to the [facility] under IVC with worsening psychosis and behavioral problems. Collateral information obtained from patient's grandmother patient has diagnosis of schizophrenia noncompliant with her medications made suicidal statement to her grandmother that she want to die.  On evaluation today, the patient was seen at bedside. She did not get out of bed. She appeared calm and her affect was flat. When asked, she stated she is fine. Said she slept okay and ate her breakfast and lunch. When inquired about suicide, she denied having it today but said she had SI yesterday. She was disorganized and only mumbled. She was not observed responding to internal stimuli. However, her nurse reported that she has been very psychotic and paces on the hallways and talk to herself and sometimes responds to people who are not there. No agitation.   Principal Problem: Schizophrenia ([place]) Diagnosis: Principal Problem:   Schizophrenia ([place]) Active Problems:   Stimulant use disorder   Cannabis use disorder, mild, abuse  Total Time spent with patient: 30 minutes  Past Psychiatric History:   Prior Psychiatric diagnoses: Schizophrenia Past Psychiatric Hospitalizations: [facility] 2021 and [facility] 2023   History of self mutilation: None noted Past suicide attempts: None noted, patient denies Past history of HI, violent or aggressive behavior: None noted   Past Psychiatric medications trials: Kirt Boys as noted above but noncompliant History of ECT/TMS: None noted   Outpatient psychiatric Follow up: Noncompliant Prior Outpatient Therapy: Unknown  Past Medical History:  Past Medical History:  Diagnosis Date   Exercise-induced asthma    No past surgical history on  file. Family History:  Family History  Problem Relation Age of Onset   Hypertension Father    Diabetes Maternal Grandmother    Heart attack Cousin        Early 65s   Family Psychiatric  History: unknown Social History:  Social History   Substance and Sexual Activity  Alcohol Use No     Social History   Substance and Sexual Activity  Drug Use No    Social History   Socioeconomic History   Marital status: Single    Spouse name: Not on file   Number of children: Not on file   Years of education: Not on file   Highest education level: Not on file  Occupational History   Not on file  Tobacco Use   Smoking status: Never   Smokeless tobacco: Never  Vaping Use   Vaping Use: Never used  Substance and Sexual Activity   Alcohol use: No   Drug use: No   Sexual activity: Never  Other Topics Concern   Not on file  Social History Narrative   Pt lives with mother, father (or step father, this information was not clarified to me) and grandmother.   Social Determinants of Health   Financial Resource Strain: Not on file  Food Insecurity: Not on file  Transportation Needs: Not on file  Physical Activity: Not on file  Stress: Not on file  Social Connections: Not on file   Additional Social History:   Current Medications: Current Facility-Administered Medications  Medication Dose Route Frequency Provider Last Rate Last Admin   acetaminophen (TYLENOL) tablet 650 mg  650 mg Oral Q6H PRN Tharon Aquas, NP   650 mg at 01/14/23 [phone]  alum & mag hydroxide-simeth (MAALOX/MYLANTA) 200-200-20 MG/5ML suspension 30 mL  30 mL Oral Q4H PRN Tharon Aquas, NP       diphenhydrAMINE (BENADRYL) capsule 50 mg  50 mg Oral TID PRN Tharon Aquas, NP       Or   diphenhydrAMINE (BENADRYL) injection 50 mg  50 mg Intramuscular TID PRN Tharon Aquas, NP       haloperidol (HALDOL) tablet 5 mg  5 mg Oral TID PRN Tharon Aquas, NP       Or   haloperidol lactate (HALDOL)  injection 5 mg  5 mg Intramuscular TID PRN Tharon Aquas, NP       hydrOXYzine (ATARAX) tablet 25 mg  25 mg Oral TID PRN Tharon Aquas, NP   25 mg at 01/15/23 [id number]   LORazepam (ATIVAN) tablet 2 mg  2 mg Oral TID PRN Tharon Aquas, NP       Or   LORazepam (ATIVAN) injection 2 mg  2 mg Intramuscular TID PRN Tharon Aquas, NP       magnesium hydroxide (MILK OF MAGNESIA) suspension 30 mL  30 mL Oral Daily PRN Tharon Aquas, NP       paliperidone (INVEGA) 24 hr tablet 6 mg  6 mg Oral Daily Winfred Leeds, Nadir, MD   6 mg at 01/15/23 [id number]   traZODone (DESYREL) tablet 50 mg  50 mg Oral QHS PRN Tharon Aquas, NP        Lab Results:  Results for orders placed or performed during the hospital encounter of 01/13/23 (from the past 48 hour(s))  HIV Antibody (routine testing w rflx)     Status: None   Collection Time: 01/14/23  6:50 PM  Result Value Ref Range   HIV Screen 4th Generation wRfx Non Reactive Non Reactive    Comment: Performed at [facility], [address] [place],  [address]    Blood Alcohol level:  Lab Results  Component Value Date   [facility] <10 01/12/2023   ETH <10 [phone]    Metabolic Disorder Labs: Lab Results  Component Value Date   HGBA1C 5.7 (H) 01/12/2023   MPG 117 01/12/2023   Lab Results  Component Value Date   PROLACTIN 5.5 01/12/2023   Lab Results  Component Value Date   CHOL 107 01/12/2023   TRIG 61 01/12/2023   HDL 30 (L) 01/12/2023   CHOLHDL 3.6 01/12/2023   VLDL 12 01/12/2023   LDLCALC 65 01/12/2023   LDLCALC 107 08/13/2012    Physical Findings: AIMS:  , ,  ,  ,    CIWA:    COWS:     Musculoskeletal: Strength & Muscle Tone: within normal limits Gait & Station:  unable to assess as pt was lying in bed Patient leans: N/A  Psychiatric Specialty Exam:  Presentation  General Appearance: Disheveled Eye Contact: Poor Speech: minimal, mumbling Speech Volume: low Handedness: Right  Mood and Affect   Mood: "fine" Affect: Flat  Thought Process  Thought Processes:Disorganized Descriptions of Associations: loose Orientation: unable to assess due to poor cooperation Thought Content: delusions History of Schizophrenia/Schizoaffective disorder:Yes Duration of Psychotic Symptoms:Greater than six months Hallucinations: responds to internal stimuli Ideas of Reference: unable to assess Suicidal Thoughts: patient denies current SI but reported having it yesterday Homicidal Thoughts: patient denies  Sensorium  Memory: unable to assess due to being uncooperative Judgment:Poor Insight: Poor  Executive Functions  Concentration: Poor Attention Span:Poor Recall:\Poor Fund of Knowledge:Poor Language:Poor  Psychomotor Activity  Psychomotor Activity: slowed  Assets  Assets: Financial Resources/Insurance; Resilience   Sleep  Sleep: Fair   Physical Exam: Physical Exam Constitutional:      Appearance: She is ill-appearing.  HENT:     Head: Normocephalic and atraumatic.  Eyes:     Conjunctiva/sclera: Conjunctivae normal.     Pupils: Pupils are equal, round, and reactive to light.  Pulmonary:     Effort: Pulmonary effort is normal.  Abdominal:     General: Abdomen is flat.  Skin:    General: Skin is warm.    Review of Systems  Constitutional: Negative.   HENT: Negative.    Eyes: Negative.   Respiratory: Negative.    Cardiovascular: Negative.   Gastrointestinal: Negative.   Neurological: Negative.    Blood pressure 126/87, pulse 88, temperature 99.2 F (37.3 C), temperature source Oral, resp. rate 16, height 5\' 3"  (1.6 m), weight 50.3 kg, SpO2 100 %. Body mass index is 19.66 kg/m.   Treatment Plan Summary: Daily contact with patient to assess and evaluate symptoms and progress in treatment and Medication management. The patient is doing slightly better than she did yesterday. Her Invega was increased to 6 mg yesterday and she tolerated well. NO EPS signs. She is  still severely psychotic. Consider titration of Invega further. If patient displays behavioral issues and agitation, we'll consider adding Haldol or Zyprexa.    Principal Diagnosis: Schizophrenia ([place]) Diagnosis:  Principal Problem:   Schizophrenia ([place])     PLAN: Safety and Monitoring:             -- Involuntary admission to inpatient psychiatric unit for safety, stabilization and treatment             -- Daily contact with patient to assess and evaluate symptoms and progress in treatment             -- Patient's case to be discussed in multi-disciplinary team meeting             -- Observation Level : q15 minute checks             -- Vital signs:  q12 hours             -- Precautions: suicide, elopement, and assault   2. Medications:              Continue Invega 6 mg po daily for schizophrenia (Consider Kirt Boys if responds well to po Invega)              As needed protocol for agitation and aggression             Atarax 25 mg 3 times daily as needed for anxiety             Trazodone 50 mg at bedtime as needed for sleep             Ceftriaxone 500 mg IM once for gonorrhea/STD               The risks/benefits/side-effects/alternatives to this medication were discussed in detail with the patient and time was given for questions. The patient consents to medication trial.                -- Metabolic profile and EKG monitoring obtained while on an atypical antipsychotic (BMI: Lipid Panel: HbgA1c: QTc:)  3. Labs Reviewed: CMP no significant abnormalities noted, CBC no significant abnormalities noted, lipid profile no significant abnormalities noted, pregnancy test negative, hemoglobin A1c 5.7, TSH within normal limit, RPR reactive but sent for confirmatory testing, gonorrhea positive, chlamydia negative, UDS positive to amphetamine, cocaine and marijuana, EKG 3/18 NSR QTc 439        Lab ordered: Confirmatory testing RPR, HIV   4. Group and Therapy: --  Encouraged patient to participate in unit milieu and in scheduled group therapies              --Substance Use counseling: As patient clears cognitively will counsel regarding need to abstain completely from illicit drug use after discharge   5. Discharge Planning:              -- Social work and case management to assist with discharge planning and identification of hospital follow-up needs prior to discharge             -- Estimated LOS: 5-7 days             -- Discharge Concerns: Need to establish a safety plan; Medication compliance and effectiveness             -- Discharge Goals: Return home with outpatient referrals for mental health follow-up including medication management/psychotherapy     The patient is agreeable with the medication plan, as above. We will monitor the patient's response to pharmacologic treatment, and adjust medications as necessary. Patient is encouraged to participate in group therapy while admitted to the psychiatric unit. We will address other chronic and acute stressors, which contributed to the patient's increased psychosis, in order to reduce the risk of self-harm at discharge.     Physician Treatment Plan for Primary Diagnosis: Schizophrenia ([place]) Long Term Goal(s): Improvement in symptoms so as ready for discharge   Short Term Goals: Ability to identify changes in lifestyle to reduce recurrence of condition will improve, Ability to verbalize feelings will improve, Ability to disclose and discuss suicidal ideas, and Ability to demonstrate self-control will improve     I certify that inpatient services furnished can reasonably be expected to improve the patient's condition.      Helane Gunther, MD 01/15/2023, 1:14 PM

## 2023-01-15 NOTE — Progress Notes (Signed)
   01/15/23 0926  Psych Admission Type (Psych Patients Only)  Admission Status Involuntary  Psychosocial Assessment  Patient Complaints None  Eye Contact Avoids  Facial Expression Flat  Affect Flat  Speech Logical/coherent  Interaction Minimal  Motor Activity Slow  Appearance/Hygiene In scrubs  Behavior Characteristics Cooperative;Appropriate to situation  Mood Preoccupied  Thought Process  Coherency WDL  Content WDL  Delusions None reported or observed  Perception WDL  Hallucination None reported or observed  Judgment Limited  Confusion None  Danger to Self  Current suicidal ideation? Denies  Danger to Others  Danger to Others None reported or observed

## 2023-01-16 DIAGNOSIS — F203 Undifferentiated schizophrenia: Secondary | ICD-10-CM | POA: Diagnosis not present

## 2023-01-16 MED ORDER — PALIPERIDONE ER 6 MG PO TB24
9.0000 mg | ORAL_TABLET | Freq: Every day | ORAL | Status: DC
  Administered 2023-01-17 – 2023-01-21 (×5): 9 mg via ORAL
  Filled 2023-01-16 (×7): qty 1

## 2023-01-16 MED ORDER — ENSURE ENLIVE PO LIQD
237.0000 mL | Freq: Two times a day (BID) | ORAL | Status: DC
  Administered 2023-01-17 – 2023-01-28 (×23): 237 mL via ORAL
  Filled 2023-01-16 (×25): qty 237

## 2023-01-16 NOTE — Group Note (Signed)
Date:  01/16/2023 Time:  2:03 PM  Group Topic/Focus:  Goals Group:   The focus of this group is to help patients establish daily goals to achieve during treatment and discuss how the patient can incorporate goal setting into their daily lives to aide in recovery. Orientation:   The focus of this group is to educate the patient on the purpose and policies of crisis stabilization and provide a format to answer questions about their admission.  The group details unit policies and expectations of patients while admitted.    Participation Level:  Active  Participation Quality:  Appropriate  Affect:  Appropriate  Cognitive:  Appropriate  Insight: Appropriate  Engagement in Group:  Engaged  Modes of Intervention:  Discussion  Additional Comments:  Patient attended morning orientation/goal setting group and said that her goal for today is attend all groups.   Keylen Eckenrode W Jerryl Holzhauer 0000000, 2:03 PM

## 2023-01-16 NOTE — Progress Notes (Signed)
   01/16/23 0954  Psych Admission Type (Psych Patients Only)  Admission Status Involuntary  Psychosocial Assessment  Patient Complaints None  Eye Contact Avoids  Facial Expression Flat  Affect Flat  Speech Soft;Slow  Interaction Minimal  Motor Activity Slow  Appearance/Hygiene In scrubs  Behavior Characteristics Cooperative;Appropriate to situation  Mood Preoccupied  Thought Process  Coherency WDL  Content WDL  Delusions None reported or observed  Perception WDL  Hallucination None reported or observed  Judgment Limited  Confusion None  Danger to Self  Current suicidal ideation? Denies  Danger to Others  Danger to Others None reported or observed

## 2023-01-16 NOTE — Progress Notes (Signed)
Did not attend group 

## 2023-01-16 NOTE — Group Note (Signed)
Date:  01/16/2023 Time:  9:45 PM  Group Topic/Focus:  Wrap-Up Group:   The focus of this group is to help patients review their daily goal of treatment and discuss progress on daily workbooks.    Participation Level:  Active  Participation Quality:  Appropriate  Affect:  Appropriate  Cognitive:  Lacking  Insight: Limited  Engagement in Group:  Engaged  Modes of Intervention:  Education and Exploration  Additional Comments:  Patient attended and participated in group tonight. She reports that today she don't remember if she have learn anything.  Megan Monroe [facility] 01/16/2023, 9:45 PM

## 2023-01-16 NOTE — Progress Notes (Addendum)
    01/16/23 0604  15 Minute Checks  Location Bedroom  Visual Appearance Calm  Behavior Sleeping  Sleep (Behavioral Health Patients Only)  Calculate sleep? (Click Yes once per 24 hr at 0600 safety check) Yes  Documented sleep last 24 hours 13   Patient is awake.  Patient is much brighter this morning.  Patient speech is delayed, appears to be responding to internal stimuli with inappropriate giggling.

## 2023-01-16 NOTE — Progress Notes (Signed)
[facility] MD Progress Note  01/16/2023 12:36 PM Megan Monroe  MRN:  [id number] Subjective:   In brief; Megan Monroe is a 27 year old female with history for schizophrenia, polysubstance use who presented to the [facility] under IVC with worsening psychosis and behavioral problems. Collateral information obtained from patient's grandmother patient has diagnosis of schizophrenia noncompliant with her medications made suicidal statement to her grandmother that she want to die.  On evaluation today, the patient presents disorganized, bizarre with thought blocking, appears responding to stimuli staring and laughing inappropriately to herself at times. Denies sihi or avh vaguely " I dont know, I dont think so" she is compliant with invega and denies se to meds, no episodes of agitation note. She doesn't display any sign of TD or EPS.   Principal Problem: Schizophrenia ([place]) Diagnosis: Principal Problem:   Schizophrenia ([place]) Active Problems:   Stimulant use disorder   Cannabis use disorder, mild, abuse  Total Time spent with patient: 30 minutes  Past Psychiatric History:   Prior Psychiatric diagnoses: Schizophrenia Past Psychiatric Hospitalizations: [facility] 2021 and [facility] 2023   History of self mutilation: None noted Past suicide attempts: None noted, patient denies Past history of HI, violent or aggressive behavior: None noted   Past Psychiatric medications trials: Kirt Boys as noted above but noncompliant History of ECT/TMS: None noted   Outpatient psychiatric Follow up: Noncompliant Prior Outpatient Therapy: Unknown  Past Medical History:  Past Medical History:  Diagnosis Date   Exercise-induced asthma    No past surgical history on file. Family History:  Family History  Problem Relation Age of Onset   Hypertension Father    Diabetes Maternal Grandmother    Heart attack Cousin        Early 41s   Family Psychiatric  History:  unknown Social History:  Social History   Substance and Sexual Activity  Alcohol Use No     Social History   Substance and Sexual Activity  Drug Use No    Social History   Socioeconomic History   Marital status: Single    Spouse name: Not on file   Number of children: Not on file   Years of education: Not on file   Highest education level: Not on file  Occupational History   Not on file  Tobacco Use   Smoking status: Never   Smokeless tobacco: Never  Vaping Use   Vaping Use: Never used  Substance and Sexual Activity   Alcohol use: No   Drug use: No   Sexual activity: Never  Other Topics Concern   Not on file  Social History Narrative   Pt lives with mother, father (or step father, this information was not clarified to me) and grandmother.   Social Determinants of Health   Financial Resource Strain: Not on file  Food Insecurity: Not on file  Transportation Needs: Not on file  Physical Activity: Not on file  Stress: Not on file  Social Connections: Not on file   Additional Social History:   Current Medications: Current Facility-Administered Medications  Medication Dose Route Frequency Provider Last Rate Last Admin   acetaminophen (TYLENOL) tablet 650 mg  650 mg Oral Q6H PRN Tharon Aquas, NP   650 mg at 01/14/23 0752   alum & mag hydroxide-simeth (MAALOX/MYLANTA) 200-200-20 MG/5ML suspension 30 mL  30 mL Oral Q4H PRN Tharon Aquas, NP       diphenhydrAMINE (BENADRYL) capsule 50 mg  50 mg Oral TID PRN Tharon Aquas,  NP       Or   diphenhydrAMINE (BENADRYL) injection 50 mg  50 mg Intramuscular TID PRN Tharon Aquas, NP       haloperidol (HALDOL) tablet 5 mg  5 mg Oral TID PRN Tharon Aquas, NP       Or   haloperidol lactate (HALDOL) injection 5 mg  5 mg Intramuscular TID PRN Tharon Aquas, NP       hydrOXYzine (ATARAX) tablet 25 mg  25 mg Oral TID PRN Tharon Aquas, NP   25 mg at 01/16/23 [id number]   LORazepam (ATIVAN) tablet  2 mg  2 mg Oral TID PRN Tharon Aquas, NP       Or   LORazepam (ATIVAN) injection 2 mg  2 mg Intramuscular TID PRN Tharon Aquas, NP       magnesium hydroxide (MILK OF MAGNESIA) suspension 30 mL  30 mL Oral Daily PRN Tharon Aquas, NP       paliperidone (INVEGA) 24 hr tablet 6 mg  6 mg Oral Daily Halo Laski, MD   6 mg at 01/16/23 0809   traZODone (DESYREL) tablet 50 mg  50 mg Oral QHS PRN Tharon Aquas, NP        Lab Results:  Results for orders placed or performed during the hospital encounter of 01/13/23 (from the past 48 hour(s))  HIV Antibody (routine testing w rflx)     Status: None   Collection Time: 01/14/23  6:50 PM  Result Value Ref Range   HIV Screen 4th Generation wRfx Non Reactive Non Reactive    Comment: Performed at [facility], [address] [place], [place] [address]    Blood Alcohol level:  Lab Results  Component Value Date   [facility] <10 01/12/2023   ETH <10 [id number]    Metabolic Disorder Labs: Lab Results  Component Value Date   HGBA1C 5.7 (H) 01/12/2023   MPG 117 01/12/2023   Lab Results  Component Value Date   PROLACTIN 5.5 01/12/2023   Lab Results  Component Value Date   CHOL 107 01/12/2023   TRIG 61 01/12/2023   HDL 30 (L) 01/12/2023   CHOLHDL 3.6 01/12/2023   VLDL 12 01/12/2023   LDLCALC 65 01/12/2023   LDLCALC 107 08/13/2012    Physical Findings: AIMS:  , ,  ,  ,    CIWA:    COWS:     Musculoskeletal: Strength & Muscle Tone: within normal limits Gait & Station:  unable to assess as pt was lying in bed Patient leans: N/A  Psychiatric Specialty Exam:  Presentation  General Appearance: Disheveled Eye Contact: Poor Speech: minimal, mumbling Speech Volume: low Handedness: Right  Mood and Affect  Mood: "fine" Affect: Flat  Thought Process  Thought Processes:Disorganized Descriptions of Associations: loose Orientation: unable to assess due to poor cooperation Thought Content: delusions History  of Schizophrenia/Schizoaffective disorder:Yes Duration of Psychotic Symptoms:Greater than six months Hallucinations: responds to internal stimuli Ideas of Reference: unable to assess Suicidal Thoughts: patient denies current SI but reported having it yesterday Homicidal Thoughts: patient denies  Sensorium  Memory: unable to assess due to being uncooperative Judgment:Poor Insight: Poor  Executive Functions  Concentration: Poor Attention Span:Poor Recall:\Poor Fund of Knowledge:Poor Language:Poor  Psychomotor Activity  Psychomotor Activity: slowed  Assets  Assets: Financial Resources/Insurance; Resilience   Sleep  Sleep: Fair   Physical Exam: Physical Exam Vitals and nursing note reviewed.  HENT:     Head: Normocephalic and atraumatic.  Eyes:     Conjunctiva/sclera: Conjunctivae normal.     Pupils: Pupils are equal, round, and reactive to light.  Pulmonary:     Effort: Pulmonary effort is normal.  Abdominal:     General: Abdomen is flat.  Skin:    General: Skin is warm.    Review of Systems  Constitutional: Negative.   HENT: Negative.    Eyes: Negative.   Respiratory: Negative.    Cardiovascular: Negative.   Gastrointestinal: Negative.   Neurological: Negative.   All other systems reviewed and are negative.  Blood pressure 112/71, pulse (!) 110, temperature 98.6 F (37 C), temperature source Oral, resp. rate 16, height 5\' 3"  (1.6 m), weight 50.3 kg, SpO2 100 %. Body mass index is 19.66 kg/m.   Treatment Plan Summary: Daily contact with patient to assess and evaluate symptoms and progress in treatment and Medication management. The patient is doing slightly better than she did yesterday. Her Invega was increased to 6 mg yesterday and she tolerated well. NO EPS signs. She is still severely psychotic. Consider titration of Invega further. If patient displays behavioral issues and agitation, we'll consider adding Haldol or Zyprexa.    Principal Diagnosis:  Schizophrenia ([place]) Diagnosis:  Principal Problem:   Schizophrenia ([place])     PLAN: Safety and Monitoring:             -- Involuntary admission to inpatient psychiatric unit for safety, stabilization and treatment             -- Daily contact with patient to assess and evaluate symptoms and progress in treatment             -- Patient's case to be discussed in multi-disciplinary team meeting             -- Observation Level : q15 minute checks             -- Vital signs:  q12 hours             -- Precautions: suicide, elopement, and assault   2. Medications:              titrate invega from 6 to 9 mg po daily for schizophrenia (Consider Kirt Boys if responds well to po Invega)              As needed protocol for agitation and aggression             Atarax 25 mg 3 times daily as needed for anxiety             Trazodone 50 mg at bedtime as needed for sleep             Ceftriaxone 500 mg IM once for gonorrhea/STD, given 01/14/23               The risks/benefits/side-effects/alternatives to this medication were discussed in detail with the patient and time was given for questions. The patient consents to medication trial.                -- Metabolic profile and EKG monitoring obtained while on an atypical antipsychotic (BMI: Lipid Panel: HbgA1c: QTc:)                            3. Labs Reviewed: CMP no significant abnormalities noted, CBC no significant abnormalities noted, lipid profile no significant abnormalities noted, pregnancy test negative, hemoglobin A1c 5.7, TSH within normal limit, RPR reactive but  sent for confirmatory testing, gonorrhea positive, chlamydia negative, UDS positive to amphetamine, cocaine and marijuana, EKG 3/18 NSR QTc 439  HIV negative      Lab ordered: Confirmatory testing RPR   4. Group and Therapy: -- Encouraged patient to participate in unit milieu and in scheduled group therapies              --Substance Use counseling: As patient clears cognitively  will counsel regarding need to abstain completely from illicit drug use after discharge   5. Discharge Planning:              -- Social work and case management to assist with discharge planning and identification of hospital follow-up needs prior to discharge             -- Estimated LOS: 5-7 days             -- Discharge Concerns: Need to establish a safety plan; Medication compliance and effectiveness             -- Discharge Goals: Return home with outpatient referrals for mental health follow-up including medication management/psychotherapy     The patient is agreeable with the medication plan, as above. We will monitor the patient's response to pharmacologic treatment, and adjust medications as necessary. Patient is encouraged to participate in group therapy while admitted to the psychiatric unit. We will address other chronic and acute stressors, which contributed to the patient's increased psychosis, in order to reduce the risk of self-harm at discharge.     Physician Treatment Plan for Primary Diagnosis: Schizophrenia ([place]) Long Term Goal(s): Improvement in symptoms so as ready for discharge   Short Term Goals: Ability to identify changes in lifestyle to reduce recurrence of condition will improve, Ability to verbalize feelings will improve, Ability to disclose and discuss suicidal ideas, and Ability to demonstrate self-control will improve     I certify that inpatient services furnished can reasonably be expected to improve the patient's condition.      Malillany Kazlauskas Winfred Leeds, MD 01/16/2023, 12:36 PM

## 2023-01-16 NOTE — BHH Group Notes (Signed)
Spirituality group facilitated by Chaplain Katy Maricruz Lucero, BCC.   Group Description: Group focused on topic of hope. Patients participated in facilitated discussion around topic, connecting with one another around experiences and definitions for hope. Group members engaged with visual explorer photos, reflecting on what hope looks like for them today. Group engaged in discussion around how their definitions of hope are present today in hospital.   Modalities: Psycho-social ed, Adlerian, Narrative, MI   Patient Progress: Did not attend.  

## 2023-01-16 NOTE — Progress Notes (Signed)
    01/15/23 2200  Psych Admission Type (Psych Patients Only)  Admission Status Involuntary  Psychosocial Assessment  Patient Complaints None  Eye Contact Avoids  Facial Expression Flat  Affect Flat  Speech Logical/coherent  Interaction Minimal  Motor Activity Slow  Appearance/Hygiene In scrubs  Behavior Characteristics Cooperative;Appropriate to situation  Mood Preoccupied  Thought Process  Coherency WDL  Content WDL  Delusions None reported or observed  Perception WDL  Hallucination None reported or observed  Judgment Limited  Confusion None  Danger to Self  Current suicidal ideation? Denies  Danger to Others  Danger to Others None reported or observed

## 2023-01-16 NOTE — Group Note (Signed)
Date:  01/16/2023 Time:  2:39 PM  Group Topic/Focus:  Spirituality:   The focus of this group is to discuss how one's spirituality can aide in recovery.    Participation Level:  Minimal  Participation Quality:  Appropriate  Affect:  Appropriate  Cognitive:  Appropriate  Insight: Appropriate  Engagement in Group:  Engaged  Modes of Intervention:  Discussion  Additional Comments:  Pt attended morning music therapeutic group.    Crescent Gotham W Jocabed Cheese 0000000, 2:39 PM

## 2023-01-16 NOTE — Plan of Care (Signed)
  Problem: Nutrition: Goal: Adequate nutrition will be maintained Outcome: Progressing   Problem: Coping: Goal: Level of anxiety will decrease Outcome: Progressing   

## 2023-01-16 NOTE — Group Note (Signed)
Recreation Therapy Group Note   Group Topic:Self-Esteem  Group Date: 01/16/2023 Start Time: 1030 End Time: 1100 Facilitators: Raye Slyter-McCall, LRT,CTRS Location: [address] Dayroom   Goal Area(s) Addresses:  Patient will effectively work with peer towards shared goal.  Patient will identify skills used to make activity successful.  Patient will share challenges and verbalize solution-driven approaches used. Patient will identify how skills used during activity can be used to reach post d/c goals.    Group Description: [employer]. Patients were provided the following materials: 2 drinking straws, 5 rubber bands, 5 paper clips, 2 index cards and 2 drinking cups. Using the provided materials patients were asked to build a launching mechanism to launch a ping pong ball across the room, approximately 10 feet. Patients were divided into teams of 3-5. Instructions required all materials be incorporated into the device, functionality of items left to the peer group's discretion.   Affect/Mood: Labile   Participation Level: None   Participation Quality: Moderate Cues   Behavior: Hallucinating   Speech/Thought Process: Delusional   Insight: Impaired   Judgement: Impaired   Modes of Intervention: Art   Patient Response to Interventions:  Disengaged   Education Outcome:  Acknowledges education and In group clarification offered    Clinical Observations/Individualized Feedback: Pt did not complete activity.  Pt sitting with her head down on the table beside her.  Pt would randomly start laughing and was talking to herself at times. Pt would engage when spoken to but in a childlike manner.    Plan: Continue to engage patient in RT group sessions 2-3x/week.   Megan Monroe, LRT,CTRS 01/16/2023 12:20 PM

## 2023-01-16 NOTE — BHH Counselor (Signed)
Adult Comprehensive Assessment  Patient ID: Megan Monroe, female   DOB: 02-27-1996, 27 y.o.   MRN: [id number]  Information Source: Chart Review: Summary      Summary/Recommendations:  CSW has attempted to complete an assessment for 3 days without success.  CSW had to complete a chart review.  Upon attempting to complete assessment patient is disorganized or unwilling to speak. She has a past psychiatric history of schizophrenia with polysubstance use.  Patient has not been taking medciations and made a suicidal statement.  Patient has admitted to hearing God and seeing God to nurses and other members of her care team while she has been here.  She has been previously admitted to [facility] in 2021 and [facility] in 2023.    There are concerns that patient may be involved in human trafficking.  She has been reported as a missing person several times in the past.  Patient currently sleeping poorly.  Patient does not seem to be connected to outpatient collateral. While here, [place] can benefit from crisis stabilization, medication management, therapeutic milieu, and referrals for services.     Valoria Tamburri E Donis Kotowski. 01/16/2023

## 2023-01-17 DIAGNOSIS — F203 Undifferentiated schizophrenia: Secondary | ICD-10-CM | POA: Diagnosis not present

## 2023-01-17 NOTE — Progress Notes (Addendum)
[facility] MD Progress Note  01/17/2023 11:48 AM Megan Monroe  MRN:  [id number] Subjective:   In brief; Megan Monroe is a 27 year old female with history for schizophrenia, polysubstance use who presented to the [facility] under IVC with worsening psychosis and behavioral problems. Collateral information obtained from patient's grandmother patient has diagnosis of schizophrenia noncompliant with her medications made suicidal statement to her grandmother that she want to die.  Staff reports no incidents of agitation or aggression, chart review indicates no as needed medication given for agitation or aggression, patient used Atarax as needed for anxiety twice on 3/22 and trazodone last night 3/22 for sleep, staff reported patient had good sleep last night.  On evaluation today, the patient presents more linear than the past few days and seems to be more alert and oriented in fact today for the first time she is able to answer questions related to day month and year for example she still has delayed responses but tells me that the day is "Friday and when asking her about the month she has significant delay but later she notes "I do not know I think march" when asking her about of the year she responds "23rd" in the past few days she would answer constantly "I do not know" she tells me that she is doing "fine" she is telling me she slept fine last night and she describes okay appetite she denies SI HI or AVH when asking her specifically if she is hearing and seeing God given this what she said in the past few days she responds "yeah I do know" she does continue to appear to have thought blocking but seems to be less she does continue to appear disorganized but occasionally able to answer questions linear manner.  She does not appear responding to stimuli as much as the past few days and seems to be laughing inappropriately much less frequently during evaluation.  She does not appear paranoid  but guarded.  She denies side effects to medications, does not appear sedated or sleepy, does not appear to have any sign consistent with TD or EPS.  Principal Problem: Schizophrenia ([place]) Diagnosis: Principal Problem:   Schizophrenia ([place]) Active Problems:   Stimulant use disorder   Cannabis use disorder, mild, abuse  Total Time spent with patient: 30 minutes  Past Psychiatric History:   Prior Psychiatric diagnoses: Schizophrenia Past Psychiatric Hospitalizations: [facility] 2021 and [facility] 2023   History of self mutilation: None noted Past suicide attempts: None noted, patient denies Past history of HI, violent or aggressive behavior: None noted   Past Psychiatric medications trials: Kirt Boys as noted above but noncompliant History of ECT/TMS: None noted   Outpatient psychiatric Follow up: Noncompliant Prior Outpatient Therapy: Unknown  Past Medical History:  Past Medical History:  Diagnosis Date   Exercise-induced asthma    No past surgical history on file. Family History:  Family History  Problem Relation Age of Onset   Hypertension Father    Diabetes Maternal Grandmother    Heart attack Cousin        Early 20s   Family Psychiatric  History: unknown Social History:  Social History   Substance and Sexual Activity  Alcohol Use No     Social History   Substance and Sexual Activity  Drug Use No    Social History   Socioeconomic History   Marital status: Single    Spouse name: Not on file   Number of children: Not on file  Years of education: Not on file   Highest education level: Not on file  Occupational History   Not on file  Tobacco Use   Smoking status: Never   Smokeless tobacco: Never  Vaping Use   Vaping Use: Never used  Substance and Sexual Activity   Alcohol use: No   Drug use: No   Sexual activity: Never  Other Topics Concern   Not on file  Social History Narrative   Pt lives with mother, father (or step father, this  information was not clarified to me) and grandmother.   Social Determinants of Health   Financial Resource Strain: Not on file  Food Insecurity: Not on file  Transportation Needs: Not on file  Physical Activity: Not on file  Stress: Not on file  Social Connections: Not on file   Additional Social History:   Current Medications: Current Facility-Administered Medications  Medication Dose Route Frequency Provider Last Rate Last Admin   acetaminophen (TYLENOL) tablet 650 mg  650 mg Oral Q6H PRN Tharon Aquas, NP   650 mg at 01/14/23 0752   alum & mag hydroxide-simeth (MAALOX/MYLANTA) 200-200-20 MG/5ML suspension 30 mL  30 mL Oral Q4H PRN Tharon Aquas, NP       diphenhydrAMINE (BENADRYL) capsule 50 mg  50 mg Oral TID PRN Tharon Aquas, NP       Or   diphenhydrAMINE (BENADRYL) injection 50 mg  50 mg Intramuscular TID PRN Tharon Aquas, NP       feeding supplement (ENSURE ENLIVE / ENSURE PLUS) liquid 237 mL  237 mL Oral BID BM Massengill, Ovid Curd, MD   237 mL at 01/17/23 0919   haloperidol (HALDOL) tablet 5 mg  5 mg Oral TID PRN Tharon Aquas, NP       Or   haloperidol lactate (HALDOL) injection 5 mg  5 mg Intramuscular TID PRN Tharon Aquas, NP       hydrOXYzine (ATARAX) tablet 25 mg  25 mg Oral TID PRN Tharon Aquas, NP   25 mg at 01/16/23 2055   LORazepam (ATIVAN) tablet 2 mg  2 mg Oral TID PRN Tharon Aquas, NP       Or   LORazepam (ATIVAN) injection 2 mg  2 mg Intramuscular TID PRN Tharon Aquas, NP       magnesium hydroxide (MILK OF MAGNESIA) suspension 30 mL  30 mL Oral Daily PRN Tharon Aquas, NP       paliperidone (INVEGA) 24 hr tablet 9 mg  9 mg Oral Daily Dwan Hemmelgarn, MD   9 mg at 01/17/23 0847   traZODone (DESYREL) tablet 50 mg  50 mg Oral QHS PRN Tharon Aquas, NP   50 mg at 01/16/23 2055    Lab Results:  No results found for this or any previous visit (from the past 31 hour(s)).   Blood Alcohol level:   Lab Results  Component Value Date   ETH <10 01/12/2023   ETH <10 99991111    Metabolic Disorder Labs: Lab Results  Component Value Date   HGBA1C 5.7 (H) 01/12/2023   MPG 117 01/12/2023   Lab Results  Component Value Date   PROLACTIN 5.5 01/12/2023   Lab Results  Component Value Date   CHOL 107 01/12/2023   TRIG 61 01/12/2023   HDL 30 (L) 01/12/2023   CHOLHDL 3.6 01/12/2023   VLDL 12 01/12/2023   LDLCALC 65 01/12/2023   LDLCALC 107 08/13/2012    Physical Findings:  AIMS:  , ,  ,  ,    CIWA:    COWS:     Musculoskeletal: Strength & Muscle Tone: within normal limits Gait & Station:  unable to assess as pt was lying in bed Patient leans: N/A  Psychiatric Specialty Exam:  Presentation  General Appearance: Disheveled Eye Contact: Poor Speech: minimal, mumbling Speech Volume: low Handedness: Right  Mood and Affect  Mood: "fine" Affect: Flat  Thought Process  Thought Processes:Disorganized Descriptions of Associations: loose Orientation: unable to assess due to poor cooperation Thought Content: delusions History of Schizophrenia/Schizoaffective disorder:Yes Duration of Psychotic Symptoms:Greater than six months Hallucinations: responds to internal stimuli Ideas of Reference: unable to assess Suicidal Thoughts: patient denies current SI but reported having it yesterday Homicidal Thoughts: patient denies  Sensorium  Memory: unable to assess due to being uncooperative Judgment:Poor Insight: Poor  Executive Functions  Concentration: Poor Attention Span:Poor Recall:\Poor Fund of Knowledge:Poor Language:Poor  Psychomotor Activity  Psychomotor Activity: slowed  Assets  Assets: Financial Resources/Insurance; Resilience   Sleep  Sleep: Fair   Physical Exam: Physical Exam Vitals and nursing note reviewed.  HENT:     Head: Normocephalic and atraumatic.  Eyes:     Conjunctiva/sclera: Conjunctivae normal.     Pupils: Pupils are equal, round,  and reactive to light.  Pulmonary:     Effort: Pulmonary effort is normal.  Abdominal:     General: Abdomen is flat.  Skin:    General: Skin is warm.    Review of Systems  Constitutional: Negative.   HENT: Negative.    Eyes: Negative.   Respiratory: Negative.    Cardiovascular: Negative.   Gastrointestinal: Negative.   Neurological: Negative.   All other systems reviewed and are negative.  Blood pressure 133/79, pulse (!) 104, temperature 98.3 F (36.8 C), temperature source Oral, resp. rate 16, height 5\' 3"  (1.6 m), weight 50.3 kg, SpO2 100 %. Body mass index is 19.66 kg/m.   Treatment Plan Summary: Daily contact with patient to assess and evaluate symptoms and progress in treatment and Medication management. The patient is doing slightly better than she did yesterday. Her Invega was increased to 6 mg yesterday and she tolerated well. NO EPS signs. She is still severely psychotic. Consider titration of Invega further. If patient displays behavioral issues and agitation, we'll consider adding Haldol or Zyprexa.    Principal Diagnosis: Schizophrenia ([place]) Diagnosis:  Principal Problem:   Schizophrenia ([place])     PLAN: Safety and Monitoring:             -- Involuntary admission to inpatient psychiatric unit for safety, stabilization and treatment             -- Daily contact with patient to assess and evaluate symptoms and progress in treatment             -- Patient's case to be discussed in multi-disciplinary team meeting             -- Observation Level : q15 minute checks             -- Vital signs:  q12 hours             -- Precautions: suicide, elopement, and assault   2. Medications:              Continue Invega 9 mg po daily for schizophrenia (Consider Kirt Boys if responds well to po Invega)              As needed  protocol for agitation and aggression             Atarax 25 mg 3 times daily as needed for anxiety             Trazodone 50 mg at bedtime as needed  for sleep             Ceftriaxone 500 mg IM once for gonorrhea/STD, given 01/14/23               The risks/benefits/side-effects/alternatives to this medication were discussed in detail with the patient and time was given for questions. The patient consents to medication trial.                -- Metabolic profile and EKG monitoring obtained while on an atypical antipsychotic (BMI: Lipid Panel: HbgA1c: QTc:)                            3. Labs Reviewed: CMP no significant abnormalities noted, CBC no significant abnormalities noted, lipid profile no significant abnormalities noted, pregnancy test negative, hemoglobin A1c 5.7, TSH within normal limit, RPR reactive titer 1:2, but sent for confirmatory testing, gonorrhea positive, chlamydia negative, UDS positive to amphetamine, cocaine and marijuana, EKG 3/18 NSR QTc 439  HIV negative      Lab ordered: Confirmatory testing RPR   4. Group and Therapy: -- Encouraged patient to participate in unit milieu and in scheduled group therapies              --Substance Use counseling: As patient clears cognitively will counsel regarding need to abstain completely from illicit drug use after discharge   5. Discharge Planning:              -- Social work and case management to assist with discharge planning and identification of hospital follow-up needs prior to discharge             -- Estimated LOS: 5-7 days             -- Discharge Concerns: Need to establish a safety plan; Medication compliance and effectiveness             -- Discharge Goals: Return home with outpatient referrals for mental health follow-up including medication management/psychotherapy     The patient is agreeable with the medication plan, as above. We will monitor the patient's response to pharmacologic treatment, and adjust medications as necessary. Patient is encouraged to participate in group therapy while admitted to the psychiatric unit. We will address other chronic and acute  stressors, which contributed to the patient's increased psychosis, in order to reduce the risk of self-harm at discharge.     Physician Treatment Plan for Primary Diagnosis: Schizophrenia ([place]) Long Term Goal(s): Improvement in symptoms so as ready for discharge   Short Term Goals: Ability to identify changes in lifestyle to reduce recurrence of condition will improve, Ability to verbalize feelings will improve, Ability to disclose and discuss suicidal ideas, and Ability to demonstrate self-control will improve     I certify that inpatient services furnished can reasonably be expected to improve the patient's condition.      Garris Melhorn Winfred Leeds, MD 01/17/2023, 11:48 AM

## 2023-01-17 NOTE — Group Note (Signed)
Date:  01/17/2023 Time:  10:28 PM  Group Topic/Focus:  Wrap-Up Group:   The focus of this group is to help patients review their daily goal of treatment and discuss progress on daily workbooks.    Participation Level:  Did Not Attend  Participation Quality:    01/17/2023, 10:28 PM

## 2023-01-17 NOTE — Progress Notes (Signed)
   01/17/23 0000  Psych Admission Type (Psych Patients Only)  Admission Status Involuntary  Psychosocial Assessment  Patient Complaints None  Eye Contact Avoids  Facial Expression Flat  Affect Flat  Speech Soft  Interaction Minimal  Motor Activity Slow  Appearance/Hygiene In scrubs  Behavior Characteristics Cooperative;Appropriate to situation  Mood Preoccupied  Thought Process  Coherency WDL  Content WDL  Delusions None reported or observed  Perception WDL  Hallucination None reported or observed  Judgment Limited  Confusion None  Danger to Self  Current suicidal ideation? Denies (denies)  Danger to Others  Danger to Others None reported or observed

## 2023-01-17 NOTE — Progress Notes (Signed)
Pt is A&OX4, calm, flat, poor insight, impaired judgment, fair eye contact, bizarre, labile, denies suicidal ideations and denies homicidal ideations. Pt verbally agrees to approach staff if these become apparent and before harming self or others. Pt continues to experience auditory hallucinations and visual hallucinations. Pt denies experiencing nightmares. Mood and affect are congruent. Pt appetite is ok. No complaints of anxiety, distress, pain and/or discomfort at this time. Unable to advise regarding Pt memory (Pt holds brief conversations) and Pt hasn't displayed any injurious behaviors. Pt is medication compliant. There's no evidence of suicidal intent. Psychomotor activity was WNL. No s/s of Parkinson, Dystonia, Akathisia and/or Tardive Dyskinesia noted.

## 2023-01-17 NOTE — Progress Notes (Addendum)
Pt walking in hallway to her room, smiling with her hands cupped as though she is holding something. Writer looked into her hands, nothing was there. Pt is entertaining unseen others.

## 2023-01-18 DIAGNOSIS — F203 Undifferentiated schizophrenia: Secondary | ICD-10-CM | POA: Diagnosis not present

## 2023-01-18 NOTE — Progress Notes (Signed)
   01/17/23 2100  Psych Admission Type (Psych Patients Only)  Admission Status Involuntary  Psychosocial Assessment  Patient Complaints None  Eye Contact Poor  Facial Expression Animated  Affect Appropriate to circumstance  Interaction Minimal;Isolative  Motor Activity Slow  Appearance/Hygiene In scrubs  Behavior Characteristics Appropriate to situation;Cooperative  Mood Pleasant;Preoccupied  Thought Process  Coherency WDL  Content WDL  Delusions None reported or observed  Perception Hallucinations  Judgment Limited  Confusion None  Danger to Self  Current suicidal ideation? Denies  Danger to Others  Danger to Others None reported or observed

## 2023-01-18 NOTE — Progress Notes (Signed)
   01/18/23 0540  15 Minute Checks  Location Bedroom  Visual Appearance Calm  Behavior Sleeping  Sleep (Behavioral Health Patients Only)  Calculate sleep? (Click Yes once per 24 hr at 0600 safety check) Yes  Documented sleep last 24 hours 8.25

## 2023-01-18 NOTE — Progress Notes (Signed)
Patient has been up in the dayroom watching tv and attended group. Writer spoke with her 1:1 and she reported having had a good day. Writer asked what her plans are after discharge and she  reported that she didn't know. When asked if she had a place to go to she responded that she was going to return home with her kids. She reports that her auntie has her kids right now. She was able to answer writers questions but hesitant at times, some thought blocking during our conversation. Writer offered to have her clothes washed but she declined. Writer encouraged her to take care of her daily hygiene needs also and she agreed to shower but she never did before bedtime.   01/18/23 2236  Psych Admission Type (Psych Patients Only)  Admission Status Involuntary  Psychosocial Assessment  Patient Complaints None  Eye Contact Poor  Facial Expression Animated  Affect Appropriate to circumstance  Speech Slow;Logical/coherent  Interaction No initiation;Minimal  Motor Activity Slow  Appearance/Hygiene In scrubs;Body odor;Disheveled  Behavior Characteristics Appropriate to situation;Cooperative  Mood Pleasant;Preoccupied  Thought Process  Coherency WDL  Content WDL  Delusions None reported or observed  Perception Hallucinations  Judgment Limited  Confusion None  Danger to Self  Current suicidal ideation? Denies  Danger to Others  Danger to Others None reported or observed

## 2023-01-18 NOTE — Progress Notes (Signed)
Pt is A&OX3, calm, denies suicidal ideations, denies homicidal ideations, evidence of auditory hallucinations and visual hallucinations. Pt in bed under covers laughing with unseen others. Pt denies experiencing nightmares. Mood and affect are congruent. Pt appetite is ok. No complaints of anxiety, distress, pain and/or discomfort at this time. Pt's memory appears to be grossly intact, and Pt hasn't displayed any injurious behaviors. Pt is medication compliant. There's no evidence of suicidal intent. Psychomotor activity was WNL. No s/s of Parkinson, Dystonia, Akathisia and/or Tardive Dyskinesia noted.

## 2023-01-18 NOTE — Group Note (Signed)
Date:  01/18/2023 Time:  10:42 PM  Group Topic/Focus:  Wrap-Up Group:   The focus of this group is to help patients review their daily goal of treatment and discuss progress on daily workbooks.    Participation Level:  Active  Participation Quality:  Appropriate  Affect:  Appropriate  Cognitive:  Appropriate  Insight: Appropriate  Engagement in Group:  Engaged  Modes of Intervention:  Education and Exploration  Additional Comments:  Patient attended and participated in group tonight. She reports that today she did not set a goal for herself but she relaxed and listen to music.  Megan Monroe [facility] 01/18/2023, 10:42 PM

## 2023-01-18 NOTE — Progress Notes (Signed)
[facility] MD Progress Note  01/18/2023 11:23 AM Megan Monroe  MRN:  [id number] Subjective:   In brief; Megan Monroe is a 27 year old female with history for schizophrenia, polysubstance use who presented to the [facility] under IVC with worsening psychosis and behavioral problems. Collateral information obtained from patient's grandmother patient has diagnosis of schizophrenia noncompliant with her medications made suicidal statement to her grandmother that she want to die.  Staff reports no incidents of agitation or aggression, chart review indicates no as needed medication given for agitation or aggression, patient used Atarax as needed for anxiety twice on 3/22 and once on 3/23 and trazodone as needed for sleep on 3/22 and 3/23, staff reported patient had good sleep last night.  Per staff patient reported suicidal ideation on 3/23 but was able to contract for safety  On evaluation today, the patient presents more linear than the past few days and seems to be more alert and oriented much less appearing to be responding to stimuli or laughing inappropriately than previously when asked she denies SI HI or AVH even when asking her specifically if she is hearing voices of God or seeing God which was positively reported in the past.  She remains vague in her responses occasionally during interview she reports her sleep "wonderful" she is responding faster to this provider today uncovering her face when asked with no problems she answer some orientation questions with less delayed responses telling me today Sunday and the month is March but when asking her about the year she notes "I have no idea" she is oriented to the place as "I believe it is behavioral health" she is oriented to the city and state as [facility], [place].  She denies any paranoia or other delusions when asked.  She continues to comply with Invega medication treatment and denies side effect and does not display any  signs consistent with TD or EPS. Principal Problem: Schizophrenia ([place]) Diagnosis: Principal Problem:   Schizophrenia ([place]) Active Problems:   Stimulant use disorder   Cannabis use disorder, mild, abuse  Total Time spent with patient: 30 minutes  Past Psychiatric History:   Prior Psychiatric diagnoses: Schizophrenia Past Psychiatric Hospitalizations: [facility] 2021 and [facility] 2023   History of self mutilation: None noted Past suicide attempts: None noted, patient denies Past history of HI, violent or aggressive behavior: None noted   Past Psychiatric medications trials: Kirt Boys as noted above but noncompliant History of ECT/TMS: None noted   Outpatient psychiatric Follow up: Noncompliant Prior Outpatient Therapy: Unknown  Past Medical History:  Past Medical History:  Diagnosis Date   Exercise-induced asthma    No past surgical history on file. Family History:  Family History  Problem Relation Age of Onset   Hypertension Father    Diabetes Maternal Grandmother    Heart attack Cousin        Early 77s   Family Psychiatric  History: unknown Social History:  Social History   Substance and Sexual Activity  Alcohol Use No     Social History   Substance and Sexual Activity  Drug Use No    Social History   Socioeconomic History   Marital status: Single    Spouse name: Not on file   Number of children: Not on file   Years of education: Not on file   Highest education level: Not on file  Occupational History   Not on file  Tobacco Use   Smoking status: Never   Smokeless tobacco: Never  Vaping Use   Vaping Use: Never used  Substance and Sexual Activity   Alcohol use: No   Drug use: No   Sexual activity: Never  Other Topics Concern   Not on file  Social History Narrative   Pt lives with mother, father (or step father, this information was not clarified to me) and grandmother.   Social Determinants of Health   Financial Resource  Strain: Not on file  Food Insecurity: Not on file  Transportation Needs: Not on file  Physical Activity: Not on file  Stress: Not on file  Social Connections: Not on file   Additional Social History:   Current Medications: Current Facility-Administered Medications  Medication Dose Route Frequency Provider Last Rate Last Admin   acetaminophen (TYLENOL) tablet 650 mg  650 mg Oral Q6H PRN Tharon Aquas, NP   650 mg at 01/14/23 0752   alum & mag hydroxide-simeth (MAALOX/MYLANTA) 200-200-20 MG/5ML suspension 30 mL  30 mL Oral Q4H PRN Tharon Aquas, NP       diphenhydrAMINE (BENADRYL) capsule 50 mg  50 mg Oral TID PRN Tharon Aquas, NP       Or   diphenhydrAMINE (BENADRYL) injection 50 mg  50 mg Intramuscular TID PRN Tharon Aquas, NP       feeding supplement (ENSURE ENLIVE / ENSURE PLUS) liquid 237 mL  237 mL Oral BID BM Massengill, Ovid Curd, MD   237 mL at 01/18/23 0903   haloperidol (HALDOL) tablet 5 mg  5 mg Oral TID PRN Tharon Aquas, NP       Or   haloperidol lactate (HALDOL) injection 5 mg  5 mg Intramuscular TID PRN Tharon Aquas, NP       hydrOXYzine (ATARAX) tablet 25 mg  25 mg Oral TID PRN Tharon Aquas, NP   25 mg at 01/17/23 2059   LORazepam (ATIVAN) tablet 2 mg  2 mg Oral TID PRN Tharon Aquas, NP       Or   LORazepam (ATIVAN) injection 2 mg  2 mg Intramuscular TID PRN Tharon Aquas, NP       magnesium hydroxide (MILK OF MAGNESIA) suspension 30 mL  30 mL Oral Daily PRN Tharon Aquas, NP       paliperidone (INVEGA) 24 hr tablet 9 mg  9 mg Oral Daily Tashiba Timoney, MD   9 mg at 01/18/23 0716   traZODone (DESYREL) tablet 50 mg  50 mg Oral QHS PRN Tharon Aquas, NP   50 mg at 01/17/23 2059    Lab Results:  No results found for this or any previous visit (from the past 82 hour(s)).   Blood Alcohol level:  Lab Results  Component Value Date   ETH <10 01/12/2023   ETH <10 99991111    Metabolic Disorder  Labs: Lab Results  Component Value Date   HGBA1C 5.7 (H) 01/12/2023   MPG 117 01/12/2023   Lab Results  Component Value Date   PROLACTIN 5.5 01/12/2023   Lab Results  Component Value Date   CHOL 107 01/12/2023   TRIG 61 01/12/2023   HDL 30 (L) 01/12/2023   CHOLHDL 3.6 01/12/2023   VLDL 12 01/12/2023   LDLCALC 65 01/12/2023   LDLCALC 107 08/13/2012    Physical Findings: AIMS:  , ,  ,  ,    CIWA:    COWS:     Musculoskeletal: Strength & Muscle Tone: within normal limits Gait & Station:  unable to assess as  pt was lying in bed Patient leans: N/A  Psychiatric Specialty Exam:  Presentation  General Appearance: Disheveled Eye Contact: Poor Speech: minimal, mumbling Speech Volume: low Handedness: Right  Mood and Affect  Mood: "fine" Affect: Flat  Thought Process  Thought Processes:Disorganized Descriptions of Associations: loose Orientation: unable to assess due to poor cooperation Thought Content: delusions History of Schizophrenia/Schizoaffective disorder:Yes Duration of Psychotic Symptoms:Greater than six months Hallucinations: responds to internal stimuli Ideas of Reference: unable to assess Suicidal Thoughts: patient denies current SI but reported having it yesterday Homicidal Thoughts: patient denies  Sensorium  Memory: unable to assess due to being uncooperative Judgment:Poor Insight: Poor  Executive Functions  Concentration: Poor Attention Span:Poor Recall:\Poor Fund of Knowledge:Poor Language:Poor  Psychomotor Activity  Psychomotor Activity: slowed  Assets  Assets: Financial Resources/Insurance; Resilience   Sleep  Sleep: Fair   Physical Exam: Physical Exam Vitals and nursing note reviewed.  HENT:     Head: Normocephalic and atraumatic.  Eyes:     Conjunctiva/sclera: Conjunctivae normal.     Pupils: Pupils are equal, round, and reactive to light.  Pulmonary:     Effort: Pulmonary effort is normal.  Abdominal:     General:  Abdomen is flat.  Skin:    General: Skin is warm.    Review of Systems  Constitutional: Negative.   HENT: Negative.    Eyes: Negative.   Respiratory: Negative.    Cardiovascular: Negative.   Gastrointestinal: Negative.   Neurological: Negative.   All other systems reviewed and are negative.  Blood pressure 133/79, pulse (!) 104, temperature 98.3 F (36.8 C), temperature source Oral, resp. rate 16, height 5\' 3"  (1.6 m), weight 50.3 kg, SpO2 100 %. Body mass index is 19.66 kg/m.   Treatment Plan Summary: Daily contact with patient to assess and evaluate symptoms and progress in treatment and Medication management. The patient is doing slightly better than she did yesterday. Her Invega was increased to 6 mg yesterday and she tolerated well. NO EPS signs. She is still severely psychotic. Consider titration of Invega further. If patient displays behavioral issues and agitation, we'll consider adding Haldol or Zyprexa.    Principal Diagnosis: Schizophrenia ([place]) Diagnosis:  Principal Problem:   Schizophrenia ([place])     PLAN: Safety and Monitoring:             -- Involuntary admission to inpatient psychiatric unit for safety, stabilization and treatment             -- Daily contact with patient to assess and evaluate symptoms and progress in treatment             -- Patient's case to be discussed in multi-disciplinary team meeting             -- Observation Level : q15 minute checks             -- Vital signs:  q12 hours             -- Precautions: suicide, elopement, and assault   2. Medications:              Continue Invega 9 mg po daily for schizophrenia, consider titrating to 12 mg daily in the next few days if needed (Consider Kirt Boys if responds well to po Invega)              As needed protocol for agitation and aggression             Atarax 25 mg 3 times daily  as needed for anxiety             Trazodone 50 mg at bedtime as needed for sleep             Ceftriaxone  500 mg IM once for gonorrhea/STD, given 01/14/23               The risks/benefits/side-effects/alternatives to this medication were discussed in detail with the patient and time was given for questions. The patient consents to medication trial.                -- Metabolic profile and EKG monitoring obtained while on an atypical antipsychotic (BMI: Lipid Panel: HbgA1c: QTc:)                            3. Labs Reviewed: CMP no significant abnormalities noted, CBC no significant abnormalities noted, lipid profile no significant abnormalities noted, pregnancy test negative, hemoglobin A1c 5.7, TSH within normal limit, RPR reactive titer 1:2, but sent for confirmatory testing, gonorrhea positive, chlamydia negative, UDS positive to amphetamine, cocaine and marijuana, EKG 3/18 NSR QTc 439  HIV negative      Lab ordered: Confirmatory testing RPR   4. Group and Therapy: -- Encouraged patient to participate in unit milieu and in scheduled group therapies              --Substance Use counseling: As patient clears cognitively will counsel regarding need to abstain completely from illicit drug use after discharge   5. Discharge Planning:              -- Social work and case management to assist with discharge planning and identification of hospital follow-up needs prior to discharge             -- Estimated LOS: 5-7 days             -- Discharge Concerns: Need to establish a safety plan; Medication compliance and effectiveness             -- Discharge Goals: Return home with outpatient referrals for mental health follow-up including medication management/psychotherapy     The patient is agreeable with the medication plan, as above. We will monitor the patient's response to pharmacologic treatment, and adjust medications as necessary. Patient is encouraged to participate in group therapy while admitted to the psychiatric unit. We will address other chronic and acute stressors, which contributed to the  patient's increased psychosis, in order to reduce the risk of self-harm at discharge.     Physician Treatment Plan for Primary Diagnosis: Schizophrenia ([place]) Long Term Goal(s): Improvement in symptoms so as ready for discharge   Short Term Goals: Ability to identify changes in lifestyle to reduce recurrence of condition will improve, Ability to verbalize feelings will improve, Ability to disclose and discuss suicidal ideas, and Ability to demonstrate self-control will improve     I certify that inpatient services furnished can reasonably be expected to improve the patient's condition.      Marcellino Fidalgo Winfred Leeds, MD 01/18/2023, 11:23 AM

## 2023-01-19 ENCOUNTER — Encounter (HOSPITAL_COMMUNITY): Payer: Self-pay

## 2023-01-19 DIAGNOSIS — F203 Undifferentiated schizophrenia: Secondary | ICD-10-CM | POA: Diagnosis not present

## 2023-01-19 NOTE — Progress Notes (Signed)
   01/19/23 1032  Psych Admission Type (Psych Patients Only)  Admission Status Involuntary  Psychosocial Assessment  Patient Complaints None  Eye Contact Brief  Facial Expression Animated  Affect Appropriate to circumstance  Speech Soft;Tangential  Interaction Guarded;Superficial  Motor Activity Restless;Rigidity;Fidgety  Appearance/Hygiene Disheveled;Poor hygiene  Behavior Characteristics Cooperative  Mood Preoccupied;Pleasant  Aggressive Behavior  Effect No apparent injury  Thought Process  Coherency Concrete thinking  Content Preoccupation  Delusions None reported or observed  Perception Hallucinations  Hallucination Auditory  Judgment Limited  Confusion Mild  Danger to Self  Current suicidal ideation? Denies  Danger to Others  Danger to Others None reported or observed   Support, reassurance and encouragement offered. Safety checks maintained at Q 15 minutes intervals without incident. Showered, changed her clothes with staff assistance. Attended groups, interacted with peers at brief intervals. Tolerates meals and fluids well. Denies concerns at this time.

## 2023-01-19 NOTE — Group Note (Signed)
Recreation Therapy Group Note   Group Topic:Personal Development  Group Date: 01/19/2023 Start Time: 1020 End Time: 1105 Facilitators: Kevan Prouty-McCall, LRT,CTRS Location: [address] Dayroom   Goal Area(s) Addresses:  Patient will successfully identify what triggers them. Patient will successfully identify ways they can deal with triggers.     Group Description: Triggers.  Patients were to identify what a trigger is and the problems they have caused. Patients were to then identify their three biggest triggers, strategies they use to avoid/reduce exposure to them and lastly, strategies they use to deal with triggers head on. LRT and patients went on to discuss what other methods could be used when dealing with triggers if the initial strategy didn't work.    Affect/Mood: Labile   Participation Level: Minimal   Participation Quality: Moderate Cues   Behavior: Bizarre and Hallucinating   Speech/Thought Process: Disorganized   Insight: Lacking   Judgement: Lacking    Modes of Intervention: Worksheet   Patient Response to Interventions:  Challenging    Education Outcome:  In group clarification offered    Clinical Observations/Individualized Feedback: Pt was unable to focus during group.  Pt would randomly laugh throughout group.  Pt also staring off, smiling at something not there.  When prompted pt did identify triggers as disrespectful people and people getting mad for no reason.  Pt went back to previous behaviors.      Plan: Continue to engage patient in RT group sessions 2-3x/week.   Emerly Prak-McCall, LRT,CTRS  01/19/2023 12:14 PM

## 2023-01-19 NOTE — Progress Notes (Signed)
[facility] MD Progress Note  01/19/2023 12:27 PM Megan Monroe  MRN:  [id number] Subjective:   In brief; Megan Monroe is a 27 year old female with history for schizophrenia, polysubstance use who presented to the [facility] under IVC with worsening psychosis and behavioral problems. Collateral information obtained from patient's grandmother patient has diagnosis of schizophrenia noncompliant with her medications made suicidal statement to her grandmother that she want to die.  Staff reports no incidents of agitation or aggression, chart review indicates no as needed medication given for agitation or aggression, patient used Atarax as needed for anxiety twice on 3/22 and once on 3/23 and trazodone as needed for sleep on 3/22 and 3/23, staff reported patient had good sleep last night.  Per staff patient is more fluent and answering questions but continues to be laughing to herself less loud and less intense.  On evaluation today, patient presents lying in bed covered with blankets but when calling her name she responds by uncovering her face and turning to the side 2 phases provided she does answer questions in a linear manner saying that she is doing fine this morning and she had good sleep she tells me that she is taking her medicine and denies side effect she is unable to recall being on [place] injection monthly previously she denies to me auditory or visual hallucination even when I ask if her specifically if she is hearing voices of God or seeing him.  She denies paranoia or other delusions she is disoriented to the day of the week but answers sounded she is oriented to the month as March and disoriented to the year "I do not know" during evaluation she appears with much less thought blocking than previously, continues to appear laughing to herself inappropriately at times but much less frequent and less loud than before.  I attempted to contact Ms. Megan Salon patient's  grandmother using phone number on the chart but no response  Principal Problem: Schizophrenia ([place]) Diagnosis: Principal Problem:   Schizophrenia ([place]) Active Problems:   Stimulant use disorder   Cannabis use disorder, mild, abuse  Total Time spent with patient: 30 minutes  Past Psychiatric History:   Prior Psychiatric diagnoses: Schizophrenia Past Psychiatric Hospitalizations: [facility] 2021 and [facility] 2023   History of self mutilation: None noted Past suicide attempts: None noted, patient denies Past history of HI, violent or aggressive behavior: None noted   Past Psychiatric medications trials: Kirt Boys as noted above but noncompliant History of ECT/TMS: None noted   Outpatient psychiatric Follow up: Noncompliant Prior Outpatient Therapy: Unknown  Past Medical History:  Past Medical History:  Diagnosis Date   Exercise-induced asthma    No past surgical history on file. Family History:  Family History  Problem Relation Age of Onset   Hypertension Father    Diabetes Maternal Grandmother    Heart attack Cousin        Early 34s   Family Psychiatric  History: unknown Social History:  Social History   Substance and Sexual Activity  Alcohol Use No     Social History   Substance and Sexual Activity  Drug Use No    Social History   Socioeconomic History   Marital status: Single    Spouse name: Not on file   Number of children: Not on file   Years of education: Not on file   Highest education level: Not on file  Occupational History   Not on file  Tobacco Use   Smoking status:  Never   Smokeless tobacco: Never  Vaping Use   Vaping Use: Never used  Substance and Sexual Activity   Alcohol use: No   Drug use: No   Sexual activity: Never  Other Topics Concern   Not on file  Social History Narrative   Pt lives with mother, father (or step father, this information was not clarified to me) and grandmother.   Social Determinants of Health    Financial Resource Strain: Not on file  Food Insecurity: Not on file  Transportation Needs: Not on file  Physical Activity: Not on file  Stress: Not on file  Social Connections: Not on file   Additional Social History:   Current Medications: Current Facility-Administered Medications  Medication Dose Route Frequency Provider Last Rate Last Admin   acetaminophen (TYLENOL) tablet 650 mg  650 mg Oral Q6H PRN Tharon Aquas, NP   650 mg at 01/14/23 0752   alum & mag hydroxide-simeth (MAALOX/MYLANTA) 200-200-20 MG/5ML suspension 30 mL  30 mL Oral Q4H PRN Tharon Aquas, NP       diphenhydrAMINE (BENADRYL) capsule 50 mg  50 mg Oral TID PRN Tharon Aquas, NP       Or   diphenhydrAMINE (BENADRYL) injection 50 mg  50 mg Intramuscular TID PRN Tharon Aquas, NP       feeding supplement (ENSURE ENLIVE / ENSURE PLUS) liquid 237 mL  237 mL Oral BID BM Massengill, Ovid Curd, MD   237 mL at 01/19/23 0909   haloperidol (HALDOL) tablet 5 mg  5 mg Oral TID PRN Tharon Aquas, NP       Or   haloperidol lactate (HALDOL) injection 5 mg  5 mg Intramuscular TID PRN Tharon Aquas, NP       hydrOXYzine (ATARAX) tablet 25 mg  25 mg Oral TID PRN Tharon Aquas, NP   25 mg at 01/18/23 2102   LORazepam (ATIVAN) tablet 2 mg  2 mg Oral TID PRN Tharon Aquas, NP       Or   LORazepam (ATIVAN) injection 2 mg  2 mg Intramuscular TID PRN Tharon Aquas, NP       magnesium hydroxide (MILK OF MAGNESIA) suspension 30 mL  30 mL Oral Daily PRN Tharon Aquas, NP       paliperidone (INVEGA) 24 hr tablet 9 mg  9 mg Oral Daily Khelani Kops, MD   9 mg at 01/19/23 0900   traZODone (DESYREL) tablet 50 mg  50 mg Oral QHS PRN Tharon Aquas, NP   50 mg at 01/18/23 2102    Lab Results:  No results found for this or any previous visit (from the past 39 hour(s)).   Blood Alcohol level:  Lab Results  Component Value Date   ETH <10 01/12/2023   ETH <10 99991111     Metabolic Disorder Labs: Lab Results  Component Value Date   HGBA1C 5.7 (H) 01/12/2023   MPG 117 01/12/2023   Lab Results  Component Value Date   PROLACTIN 5.5 01/12/2023   Lab Results  Component Value Date   CHOL 107 01/12/2023   TRIG 61 01/12/2023   HDL 30 (L) 01/12/2023   CHOLHDL 3.6 01/12/2023   VLDL 12 01/12/2023   LDLCALC 65 01/12/2023   LDLCALC 107 08/13/2012    Physical Findings: AIMS:  , ,  ,  ,    CIWA:    COWS:     Musculoskeletal: Strength & Muscle Tone: within normal limits Gait &  Station:  unable to assess as pt was lying in bed Patient leans: N/A  Psychiatric Specialty Exam:  Presentation  General Appearance: Disheveled Eye Contact: Poor Speech: minimal, mumbling Speech Volume: low Handedness: Right  Mood and Affect  Mood: "fine" Affect: Flat  Thought Process  Thought Processes:Disorganized Descriptions of Associations: loose Orientation: unable to assess due to poor cooperation Thought Content: delusions History of Schizophrenia/Schizoaffective disorder:Yes Duration of Psychotic Symptoms:Greater than six months Hallucinations: responds to internal stimuli Ideas of Reference: unable to assess Suicidal Thoughts: patient denies current SI but reported having it yesterday Homicidal Thoughts: patient denies  Sensorium  Memory: unable to assess due to being uncooperative Judgment:Poor Insight: Poor  Executive Functions  Concentration: Poor Attention Span:Poor Recall:\Poor Fund of Knowledge:Poor Language:Poor  Psychomotor Activity  Psychomotor Activity: slowed  Assets  Assets: Financial Resources/Insurance; Resilience   Sleep  Sleep: Fair   Physical Exam: Physical Exam Vitals and nursing note reviewed.  HENT:     Head: Normocephalic and atraumatic.  Eyes:     Conjunctiva/sclera: Conjunctivae normal.     Pupils: Pupils are equal, round, and reactive to light.  Pulmonary:     Effort: Pulmonary effort is normal.   Abdominal:     General: Abdomen is flat.  Skin:    General: Skin is warm.    Review of Systems  Constitutional: Negative.   HENT: Negative.    Eyes: Negative.   Respiratory: Negative.    Cardiovascular: Negative.   Gastrointestinal: Negative.   Neurological: Negative.   All other systems reviewed and are negative.  Blood pressure 114/70, pulse 87, temperature 98.3 F (36.8 C), temperature source Oral, resp. rate 16, height 5\' 3"  (1.6 m), weight 50.3 kg, SpO2 100 %. Body mass index is 19.66 kg/m.   Treatment Plan Summary: Daily contact with patient to assess and evaluate symptoms and progress in treatment and Medication management. The patient is doing slightly better than she did yesterday. Her Invega was increased to 6 mg yesterday and she tolerated well. NO EPS signs. She is still severely psychotic. Consider titration of Invega further. If patient displays behavioral issues and agitation, we'll consider adding Haldol or Zyprexa.    Principal Diagnosis: Schizophrenia ([place]) Diagnosis:  Principal Problem:   Schizophrenia ([place])     PLAN: Safety and Monitoring:             -- Involuntary admission to inpatient psychiatric unit for safety, stabilization and treatment             -- Daily contact with patient to assess and evaluate symptoms and progress in treatment             -- Patient's case to be discussed in multi-disciplinary team meeting             -- Observation Level : q15 minute checks             -- Vital signs:  q12 hours             -- Precautions: suicide, elopement, and assault   2. Medications:              Continue Invega 9 mg po daily for schizophrenia, consider titrating to 12 mg daily in the next few days if needed (Consider Kirt Boys if responds well to po Invega)              As needed protocol for agitation and aggression             Atarax  25 mg 3 times daily as needed for anxiety             Trazodone 50 mg at bedtime as needed for sleep              Ceftriaxone 500 mg IM once for gonorrhea/STD, given 01/14/23               The risks/benefits/side-effects/alternatives to this medication were discussed in detail with the patient and time was given for questions. The patient consents to medication trial.                -- Metabolic profile and EKG monitoring obtained while on an atypical antipsychotic (BMI: Lipid Panel: HbgA1c: QTc:)                            3. Labs Reviewed: CMP no significant abnormalities noted, CBC no significant abnormalities noted, lipid profile no significant abnormalities noted, pregnancy test negative, hemoglobin A1c 5.7, TSH within normal limit, RPR reactive titer 1:2, but sent for confirmatory testing, gonorrhea positive, chlamydia negative, UDS positive to amphetamine, cocaine and marijuana, EKG 3/18 NSR QTc 439  HIV negative      Lab ordered: Confirmatory testing RPR   4. Group and Therapy: -- Encouraged patient to participate in unit milieu and in scheduled group therapies              --Substance Use counseling: As patient clears cognitively will counsel regarding need to abstain completely from illicit drug use after discharge   5. Discharge Planning:              -- Social work and case management to assist with discharge planning and identification of hospital follow-up needs prior to discharge             -- Estimated LOS: 5-7 days             -- Discharge Concerns: Need to establish a safety plan; Medication compliance and effectiveness             -- Discharge Goals: Return home with outpatient referrals for mental health follow-up including medication management/psychotherapy     The patient is agreeable with the medication plan, as above. We will monitor the patient's response to pharmacologic treatment, and adjust medications as necessary. Patient is encouraged to participate in group therapy while admitted to the psychiatric unit. We will address other chronic and acute stressors, which  contributed to the patient's increased psychosis, in order to reduce the risk of self-harm at discharge.     Physician Treatment Plan for Primary Diagnosis: Schizophrenia ([place]) Long Term Goal(s): Improvement in symptoms so as ready for discharge   Short Term Goals: Ability to identify changes in lifestyle to reduce recurrence of condition will improve, Ability to verbalize feelings will improve, Ability to disclose and discuss suicidal ideas, and Ability to demonstrate self-control will improve     I certify that inpatient services furnished can reasonably be expected to improve the patient's condition.      Paymon Rosensteel Winfred Leeds, MD 01/19/2023, 12:27 PM

## 2023-01-19 NOTE — Progress Notes (Signed)
Pt was encouraged to attend group discussion but refused 

## 2023-01-19 NOTE — Progress Notes (Signed)
D: Pt denies SI/HI/AVH. Pt is pleasant and cooperative. Pt rated anxiety and depression 0/10.  A: Pt was offered support and encouragement. Pt was given scheduled medications. Pt was encouraged to attend groups. Q 15 minute checks were done for safety.   R: Pt did not attend group. Pt interacted well with peers and staff at brief intervals. Pt is taking medication. Pt has no complaints.Pt receptive to treatment and safety maintained on unit.

## 2023-01-19 NOTE — Progress Notes (Signed)
   01/19/23 0543  15 Minute Checks  Location Bedroom  Visual Appearance Calm  Behavior Sleeping  Sleep (Behavioral Health Patients Only)  Calculate sleep? (Click Yes once per 24 hr at 0600 safety check) Yes  Documented sleep last 24 hours 8.75

## 2023-01-19 NOTE — Progress Notes (Signed)
Adult Psychoeducational Group Note  Date:  01/19/2023 Time:  9:23 AM  Group Topic/Focus:  Goals Group:   The focus of this group is to help patients establish daily goals to achieve during treatment and discuss how the patient can incorporate goal setting into their daily lives to aide in recovery.  Participation Level:  Active  Participation Quality:  Appropriate  Affect:  Appropriate  Cognitive:  Appropriate  Insight: Appropriate  Engagement in Group:  Engaged  Modes of Intervention:  Discussion  Additional Comments: The patient engage in group.  Megan Monroe 01/19/2023, 9:23 AM

## 2023-01-19 NOTE — Plan of Care (Signed)
  Problem: Education: Goal: Knowledge of General Education information will improve Description Including pain rating scale, medication(s)/side effects and non-pharmacologic comfort measures Outcome: Progressing   

## 2023-01-19 NOTE — BH IP Treatment Plan (Signed)
Interdisciplinary Treatment and Diagnostic Plan Update  01/19/2023 Time of Session: 8:30am, update Megan Monroe MRN: [id number]  Principal Diagnosis: Schizophrenia ([place])  Secondary Diagnoses: Principal Problem:   Schizophrenia ([place]) Active Problems:   Stimulant use disorder   Cannabis use disorder, mild, abuse   Current Medications:  Current Facility-Administered Medications  Medication Dose Route Frequency Provider Last Rate Last Admin   acetaminophen (TYLENOL) tablet 650 mg  650 mg Oral Q6H PRN Tharon Aquas, NP   650 mg at 01/14/23 0752   alum & mag hydroxide-simeth (MAALOX/MYLANTA) 200-200-20 MG/5ML suspension 30 mL  30 mL Oral Q4H PRN Tharon Aquas, NP       diphenhydrAMINE (BENADRYL) capsule 50 mg  50 mg Oral TID PRN Tharon Aquas, NP       Or   diphenhydrAMINE (BENADRYL) injection 50 mg  50 mg Intramuscular TID PRN Tharon Aquas, NP       feeding supplement (ENSURE ENLIVE / ENSURE PLUS) liquid 237 mL  237 mL Oral BID BM Massengill, Ovid Curd, MD   237 mL at 01/19/23 0909   haloperidol (HALDOL) tablet 5 mg  5 mg Oral TID PRN Tharon Aquas, NP       Or   haloperidol lactate (HALDOL) injection 5 mg  5 mg Intramuscular TID PRN Tharon Aquas, NP       hydrOXYzine (ATARAX) tablet 25 mg  25 mg Oral TID PRN Tharon Aquas, NP   25 mg at 01/18/23 2102   LORazepam (ATIVAN) tablet 2 mg  2 mg Oral TID PRN Tharon Aquas, NP       Or   LORazepam (ATIVAN) injection 2 mg  2 mg Intramuscular TID PRN Tharon Aquas, NP       magnesium hydroxide (MILK OF MAGNESIA) suspension 30 mL  30 mL Oral Daily PRN Tharon Aquas, NP       paliperidone (INVEGA) 24 hr tablet 9 mg  9 mg Oral Daily Attiah, Nadir, MD   9 mg at 01/19/23 0900   traZODone (DESYREL) tablet 50 mg  50 mg Oral QHS PRN Tharon Aquas, NP   50 mg at 01/18/23 2102   PTA Medications: No medications prior to admission.    Patient Stressors:    Patient Strengths:     Treatment Modalities: Medication Management, Group therapy, Case management,  1 to 1 session with clinician, Psychoeducation, Recreational therapy.   Physician Treatment Plan for Primary Diagnosis: Schizophrenia ([place]) Long Term Goal(s): Improvement in symptoms so as ready for discharge   Short Term Goals: Ability to identify changes in lifestyle to reduce recurrence of condition will improve Ability to verbalize feelings will improve Ability to disclose and discuss suicidal ideas Ability to demonstrate self-control will improve  Medication Management: Evaluate patient's response, side effects, and tolerance of medication regimen.  Therapeutic Interventions: 1 to 1 sessions, Unit Group sessions and Medication administration.  Evaluation of Outcomes: Progressing  Physician Treatment Plan for Secondary Diagnosis: Principal Problem:   Schizophrenia ([place]) Active Problems:   Stimulant use disorder   Cannabis use disorder, mild, abuse  Long Term Goal(s): Improvement in symptoms so as ready for discharge   Short Term Goals: Ability to identify changes in lifestyle to reduce recurrence of condition will improve Ability to verbalize feelings will improve Ability to disclose and discuss suicidal ideas Ability to demonstrate self-control will improve     Medication Management: Evaluate patient's response, side effects, and tolerance of medication regimen.  Therapeutic Interventions: 1 to  1 sessions, Unit Group sessions and Medication administration.  Evaluation of Outcomes: Progressing   RN Treatment Plan for Primary Diagnosis: Schizophrenia ([place]) Long Term Goal(s): Knowledge of disease and therapeutic regimen to maintain health will improve  Short Term Goals: Ability to remain free from injury will improve, Ability to verbalize frustration and anger appropriately will improve, Ability to demonstrate self-control, Ability to participate in decision making will improve, Ability to  verbalize feelings will improve, Ability to disclose and discuss suicidal ideas, Ability to identify and develop effective coping behaviors will improve, and Compliance with prescribed medications will improve  Medication Management: RN will administer medications as ordered by provider, will assess and evaluate patient's response and provide education to patient for prescribed medication. RN will report any adverse and/or side effects to prescribing provider.  Therapeutic Interventions: 1 on 1 counseling sessions, Psychoeducation, Medication administration, Evaluate responses to treatment, Monitor vital signs and CBGs as ordered, Perform/monitor CIWA, COWS, AIMS and Fall Risk screenings as ordered, Perform wound care treatments as ordered.  Evaluation of Outcomes: Progressing   LCSW Treatment Plan for Primary Diagnosis: Schizophrenia ([place]) Long Term Goal(s): Safe transition to appropriate next level of care at discharge, Engage patient in therapeutic group addressing interpersonal concerns.  Short Term Goals: Engage patient in aftercare planning with referrals and resources, Increase social support, Increase ability to appropriately verbalize feelings, Increase emotional regulation, Facilitate acceptance of mental health diagnosis and concerns, Facilitate patient progression through stages of change regarding substance use diagnoses and concerns, Identify triggers associated with mental health/substance abuse issues, and Increase skills for wellness and recovery  Therapeutic Interventions: Assess for all discharge needs, 1 to 1 time with Social worker, Explore available resources and support systems, Assess for adequacy in community support network, Educate family and significant other(s) on suicide prevention, Complete Psychosocial Assessment, Interpersonal group therapy.  Evaluation of Outcomes: Progressing   Progress in Treatment: Attending groups: No. Participating in groups: No. Taking  medication as prescribed: Yes. Toleration medication: Yes. Family/Significant other contact made: No, will contact:  Patient declined consents Patient understands diagnosis: No. Discussing patient identified problems/goals with staff: No. Medical problems stabilized or resolved: Yes. Denies suicidal/homicidal ideation: Yes. Issues/concerns per patient self-inventory: No.   New problem(s) identified: No, Describe:  none reported   New Short Term/Long Term Goal(s):  medication stabilization, elimination of SI thoughts, development of comprehensive mental wellness plan.    Patient Goals:  Patient unable to identify a goal  Discharge Plan or Barriers:  Patient not participating in discharge planning at this time.  Too psychotic to appropriately participate.   Reason for Continuation of Hospitalization: Anxiety Delusions  Hallucinations Medication stabilization  Estimated Length of Stay: 5-7 days  Last 3 [place] Suicide Severity Risk Score: [place] ED from 01/12/2023 in [facility] ED from 01/08/2020 in [facility] Emergency Department at [facility] ED from 10/27/2019 in [facility] Emergency Department at [place] No Risk No Risk No Risk       Last [facility] 2/9 Scores:     No data to display          Scribe for Treatment Team: Zachery Conch, LCSW 01/19/2023 10:26 AM

## 2023-01-20 DIAGNOSIS — F202 Catatonic schizophrenia: Secondary | ICD-10-CM | POA: Diagnosis not present

## 2023-01-20 NOTE — Group Note (Signed)
Recreation Therapy Group Note   Group Topic:Health and Wellness  Group Date: 01/20/2023 Start Time: [id number] End Time: 1110 Facilitators: Tomie Elko-McCall, LRT,CTRS Location: [address] Dayroom   Goal Area(s) Addresses:  Patient will define components of whole wellness. Patient will verbalize benefit of whole wellness.  Group Description: Exercise.  LRT and patients discussed the importance of wellness and how the elements of wellness (mental, spiritual and physical) work together.  LRT instructed patients they were going to participate in an exercise group.  Patients were informed they would take turns leading the group in various exercises.  Patients were informed to take breaks and get water as needed.   Affect/Mood: Labile   Participation Level: Active   Participation Quality: Minimal Cues   Behavior: Cooperative   Speech/Thought Process: Distracted   Insight: Poor   Judgement: Poor   Modes of Intervention: Music   Patient Response to Interventions:  Attentive and Receptive   Education Outcome:  Acknowledges education and In group clarification offered    Clinical Observations/Individualized Feedback: Pt was attentive and participated in activity.  Pt had some delayed reactions when the type of exercise would shift.  Pt needed assistance when it was her turn to lead.  When prompted by LRT, pt would respond as if she being drawn away from something else and would say "I don't know" when she had to do an exercise.  Pt wasn't laughing nearly as much as in previous groups.  Pt was receptive during activity.    Plan: Continue to engage patient in RT group sessions 2-3x/week.   Lener Ventresca-McCall, LRT,CTRS  01/20/2023 12:45 PM

## 2023-01-20 NOTE — Progress Notes (Addendum)
D: Pt denies SI/HI/AVH. Pt is pleasant. Pt reported 0/10 depression and anxiety. Pt reported a good appetite and no trouble with sleep.  A: Pt was offered support and encouragement. Pt was encouraged to attend groups. Q 15 minute checks were done for safety.    R: Pt did not attend group. Minimal interactions with peers and staff. Pt is taking medication. Pt has no complaints.Pt receptive to treatment and safety maintained on unit.

## 2023-01-20 NOTE — Progress Notes (Signed)
[facility] MD Progress Note  01/20/2023 5:55 PM Megan Monroe  MRN:  [id number] Subjective:   In brief; Megan Monroe is a 27 year old female with history for schizophrenia, polysubstance use who presented to the [facility] under IVC with worsening psychosis and behavioral problems. Collateral information obtained from patient's grandmother patient has diagnosis of schizophrenia noncompliant with her medications made suicidal statement to her grandmother that she want to die.  Progress in the last 24 hrs: Staff reports that the patient has been compliant with medications and has not had any incidents of aggression or agitation.  She slept fairly well and received hydroxyzine and trazodone as a as needed medication.  Patient seems to be a little bit more vocal and according to staff he is able to answer questions but continues to remain bizarre.  On examination today: The patient is alert and appears to be oriented.  She maintained fair eye contact.  Her affect was blunted and her speech was soft, coherent and she was able to formulate answers for the most part appropriately although she would lapse into bizarre statements.  She currently denies any auditory or visual hallucinations but appears to be responding.  Thought blocking is improving.  She denies any SI/HI/AVH.  Grandmother is yet to be contacted.  Principal Problem: Schizophrenia ([place]) Diagnosis: Principal Problem:   Schizophrenia ([place]) Active Problems:   Stimulant use disorder   Cannabis use disorder, mild, abuse  Total Time spent with patient: 30 minutes  Past Psychiatric History:   Prior Psychiatric diagnoses: Schizophrenia Past Psychiatric Hospitalizations: [facility] 2021 and [facility] 2023   History of self mutilation: None noted Past suicide attempts: None noted, patient denies Past history of HI, violent or aggressive behavior: None noted   Past Psychiatric medications trials: Kirt Boys  as noted above but noncompliant History of ECT/TMS: None noted   Outpatient psychiatric Follow up: Noncompliant Prior Outpatient Therapy: Unknown  Past Medical History:  Past Medical History:  Diagnosis Date   Exercise-induced asthma    No past surgical history on file. Family History:  Family History  Problem Relation Age of Onset   Hypertension Father    Diabetes Maternal Grandmother    Heart attack Cousin        Early 5s   Family Psychiatric  History: unknown Social History:  Social History   Substance and Sexual Activity  Alcohol Use No     Social History   Substance and Sexual Activity  Drug Use No    Social History   Socioeconomic History   Marital status: Single    Spouse name: Not on file   Number of children: Not on file   Years of education: Not on file   Highest education level: Not on file  Occupational History   Not on file  Tobacco Use   Smoking status: Never   Smokeless tobacco: Never  Vaping Use   Vaping Use: Never used  Substance and Sexual Activity   Alcohol use: No   Drug use: No   Sexual activity: Never  Other Topics Concern   Not on file  Social History Narrative   Pt lives with mother, father (or step father, this information was not clarified to me) and grandmother.   Social Determinants of Health   Financial Resource Strain: Not on file  Food Insecurity: Not on file  Transportation Needs: Not on file  Physical Activity: Not on file  Stress: Not on file  Social Connections: Not on file   Additional  Social History:   Current Medications: Current Facility-Administered Medications  Medication Dose Route Frequency Provider Last Rate Last Admin   acetaminophen (TYLENOL) tablet 650 mg  650 mg Oral Q6H PRN Tharon Aquas, NP   650 mg at 01/14/23 0752   alum & mag hydroxide-simeth (MAALOX/MYLANTA) 200-200-20 MG/5ML suspension 30 mL  30 mL Oral Q4H PRN Tharon Aquas, NP       diphenhydrAMINE (BENADRYL) capsule 50 mg  50  mg Oral TID PRN Tharon Aquas, NP       Or   diphenhydrAMINE (BENADRYL) injection 50 mg  50 mg Intramuscular TID PRN Tharon Aquas, NP       feeding supplement (ENSURE ENLIVE / ENSURE PLUS) liquid 237 mL  237 mL Oral BID BM Massengill, Ovid Curd, MD   237 mL at 01/20/23 1316   haloperidol (HALDOL) tablet 5 mg  5 mg Oral TID PRN Tharon Aquas, NP       Or   haloperidol lactate (HALDOL) injection 5 mg  5 mg Intramuscular TID PRN Tharon Aquas, NP       hydrOXYzine (ATARAX) tablet 25 mg  25 mg Oral TID PRN Tharon Aquas, NP   25 mg at 01/18/23 2102   LORazepam (ATIVAN) tablet 2 mg  2 mg Oral TID PRN Tharon Aquas, NP       Or   LORazepam (ATIVAN) injection 2 mg  2 mg Intramuscular TID PRN Tharon Aquas, NP       magnesium hydroxide (MILK OF MAGNESIA) suspension 30 mL  30 mL Oral Daily PRN Tharon Aquas, NP       paliperidone (INVEGA) 24 hr tablet 9 mg  9 mg Oral Daily Attiah, Nadir, MD   9 mg at 01/20/23 0752   traZODone (DESYREL) tablet 50 mg  50 mg Oral QHS PRN Tharon Aquas, NP   50 mg at 01/18/23 2102    Lab Results:  No results found for this or any previous visit (from the past 45 hour(s)).   Blood Alcohol level:  Lab Results  Component Value Date   ETH <10 01/12/2023   ETH <10 99991111    Metabolic Disorder Labs: Lab Results  Component Value Date   HGBA1C 5.7 (H) 01/12/2023   MPG 117 01/12/2023   Lab Results  Component Value Date   PROLACTIN 5.5 01/12/2023   Lab Results  Component Value Date   CHOL 107 01/12/2023   TRIG 61 01/12/2023   HDL 30 (L) 01/12/2023   CHOLHDL 3.6 01/12/2023   VLDL 12 01/12/2023   LDLCALC 65 01/12/2023   LDLCALC 107 08/13/2012    Physical Findings: AIMS: Facial and Oral Movements Muscles of Facial Expression: None, normal Lips and Perioral Area: None, normal Jaw: None, normal Tongue: None, normal,Extremity Movements Upper (arms, wrists, hands, fingers): None, normal Lower (legs,  knees, ankles, toes): None, normal, Trunk Movements Neck, shoulders, hips: None, normal, Overall Severity Severity of abnormal movements (highest score from questions above): None, normal Incapacitation due to abnormal movements: None, normal Patient's awareness of abnormal movements (rate only patient's report): No Awareness, Dental Status Current problems with teeth and/or dentures?: No Does patient usually wear dentures?: No  CIWA:    COWS:     Musculoskeletal: Strength & Muscle Tone: within normal limits Gait & Station:  unable to assess as pt was lying in bed Patient leans: N/A  Psychiatric Specialty Exam:  Presentation  General Appearance: Bizarre Eye Contact: Poor Speech: minimal, mumbling Speech  Volume: low Handedness: Right  Mood and Affect  Mood: "fine" Affect: Constricted  Thought Process  Thought Processes:Disorganized Descriptions of Associations: loose Orientation: unable to assess due to poor cooperation Thought Content: delusions History of Schizophrenia/Schizoaffective disorder:Yes Duration of Psychotic Symptoms:Greater than six months Hallucinations: responds to internal stimuli Ideas of Reference: unable to assess Suicidal Thoughts: patient denies current SI but reported having it yesterday Homicidal Thoughts: patient denies  Sensorium  Memory: unable to assess due to being uncooperative Judgment:Fair Insight: Poor  Executive Functions  Concentration: Fair Attention Span:Fair Recall:\Poor Fund of Knowledge:Poor Language:Fair  Psychomotor Activity  Psychomotor Activity: slowed  Assets  Assets: Desire for Improvement; Resilience   Sleep  Sleep: Fair   Physical Exam: Physical Exam Vitals and nursing note reviewed.  HENT:     Head: Normocephalic and atraumatic.  Eyes:     Conjunctiva/sclera: Conjunctivae normal.     Pupils: Pupils are equal, round, and reactive to light.  Pulmonary:     Effort: Pulmonary effort is normal.   Abdominal:     General: Abdomen is flat.  Skin:    General: Skin is warm.    Review of Systems  Constitutional: Negative.   HENT: Negative.    Eyes: Negative.   Respiratory: Negative.    Cardiovascular: Negative.   Gastrointestinal: Negative.   Neurological: Negative.   All other systems reviewed and are negative.  Blood pressure 134/83, pulse (!) 108, temperature 97.9 F (36.6 C), temperature source Oral, resp. rate 16, height 5\' 3"  (1.6 m), weight 50.3 kg, SpO2 99 %. Body mass index is 19.66 kg/m.   Treatment Plan Summary: Daily contact with patient to assess and evaluate symptoms and progress in treatment and Medication management. The patient is doing slightly better than she did yesterday. Her Invega was increased to 6 mg yesterday and she tolerated well. NO EPS signs. She is still severely psychotic. Consider titration of Invega further. If patient displays behavioral issues and agitation, we'll consider adding Haldol or Zyprexa.    Principal Diagnosis: Schizophrenia ([place]) Diagnosis:  Principal Problem:   Schizophrenia ([place])     PLAN: Safety and Monitoring:             -- Involuntary admission to inpatient psychiatric unit for safety, stabilization and treatment             -- Daily contact with patient to assess and evaluate symptoms and progress in treatment             -- Patient's case to be discussed in multi-disciplinary team meeting             -- Observation Level : q15 minute checks             -- Vital signs:  q12 hours             -- Precautions: suicide, elopement, and assault   2. Medications:              Continue Invega 9 mg po daily for schizophrenia, consider titrating to 12 mg daily in the next few days if needed (Consider Kirt Boys if responds well to po Invega)              As needed protocol for agitation and aggression             Atarax 25 mg 3 times daily as needed for anxiety             Trazodone 50 mg at bedtime as needed for  sleep              Ceftriaxone 500 mg IM once for gonorrhea/STD, given 01/14/23               The risks/benefits/side-effects/alternatives to this medication were discussed in detail with the patient and time was given for questions. The patient consents to medication trial.                -- Metabolic profile and EKG monitoring obtained while on an atypical antipsychotic (BMI: Lipid Panel: HbgA1c: QTc:)                            3. Labs Reviewed: CMP no significant abnormalities noted, CBC no significant abnormalities noted, lipid profile no significant abnormalities noted, pregnancy test negative, hemoglobin A1c 5.7, TSH within normal limit, RPR reactive titer 1:2, but sent for confirmatory testing, gonorrhea positive, chlamydia negative, UDS positive to amphetamine, cocaine and marijuana, EKG 3/18 NSR QTc 439  HIV negative      Lab ordered: Confirmatory testing RPR   4. Group and Therapy: -- Encouraged patient to participate in unit milieu and in scheduled group therapies              --Substance Use counseling: As patient clears cognitively will counsel regarding need to abstain completely from illicit drug use after discharge   5. Discharge Planning:              -- Social work and case management to assist with discharge planning and identification of hospital follow-up needs prior to discharge             -- Estimated LOS: Possible discharge by Friday.             -- Discharge Concerns: Need to establish a safety plan; Medication compliance and effectiveness             -- Discharge Goals: Return home with outpatient referrals for mental health follow-up including medication management/psychotherapy     The patient is agreeable with the medication plan, as above. We will monitor the patient's response to pharmacologic treatment, and adjust medications as necessary. Patient is encouraged to participate in group therapy while admitted to the psychiatric unit. We will address other chronic and  acute stressors, which contributed to the patient's increased psychosis, in order to reduce the risk of self-harm at discharge.     Physician Treatment Plan for Primary Diagnosis: Schizophrenia ([place]) Long Term Goal(s): Improvement in symptoms so as ready for discharge   Short Term Goals: Ability to identify changes in lifestyle to reduce recurrence of condition will improve, Ability to verbalize feelings will improve, Ability to disclose and discuss suicidal ideas, and Ability to demonstrate self-control will improve     I certify that inpatient services furnished can reasonably be expected to improve the patient's condition.      Ranae Palms, MD 01/20/2023, 5:55 PMPatient ID: Megan Monroe, female   DOB: [birth year], 27 y.o.   MRN: [id number]

## 2023-01-20 NOTE — Group Note (Signed)
Date:  01/20/2023 Time:  10:39 AM  Group Topic/Focus:  Goals Group:   The focus of this group is to help patients establish daily goals to achieve during treatment and discuss how the patient can incorporate goal setting into their daily lives to aide in recovery. Orientation:   The focus of this group is to educate the patient on the purpose and policies of crisis stabilization and provide a format to answer questions about their admission.  The group details unit policies and expectations of patients while admitted.    Participation Level:  Active  Participation Quality:  Appropriate  Affect:  Appropriate  Cognitive:  Appropriate  Insight: Appropriate  Engagement in Group:  Engaged  Modes of Intervention:  Discussion  Additional Comments: Patient attended morning orientation/goal group and said that her goal for today is to stay positive.   Shereda Graw W Chelly Dombeck [id number], 10:39 AM

## 2023-01-20 NOTE — Plan of Care (Signed)
  Problem: Education: Goal: Knowledge of General Education information will improve Description: Including pain rating scale, medication(s)/side effects and non-pharmacologic comfort measures Outcome: Progressing   Problem: Coping: Goal: Level of anxiety will decrease Outcome: Progressing   

## 2023-01-20 NOTE — Group Note (Signed)
Date:  01/20/2023 Time:  11:36 AM  Group Topic/Focus:  Managing Feelings:   The focus of this group is to identify what feelings patients have difficulty handling and develop a plan to handle them in a healthier way upon discharge.    Participation Level:  Active  Participation Quality:  Appropriate  Affect:  Appropriate  Cognitive:  Appropriate  Insight: Appropriate  Engagement in Group:  Engaged  Modes of Intervention:  Discussion  Additional Comments:  Patient had her snacks while in music therapy.   Cyan Moultrie W Becka Lagasse [id number], 11:36 AM

## 2023-01-20 NOTE — Group Note (Signed)
Date:  01/20/2023 Time:  8:41 PM  Group Topic/Focus:  Wrap-Up Group:   The focus of this group is to help patients review their daily goal of treatment and discuss progress on daily workbooks.    Participation Level:  Did Not Attend   Debe Coder 01/20/2023, 8:41 PM

## 2023-01-21 DIAGNOSIS — F202 Catatonic schizophrenia: Secondary | ICD-10-CM | POA: Diagnosis not present

## 2023-01-21 MED ORDER — PALIPERIDONE PALMITATE ER 234 MG/1.5ML IM SUSY
234.0000 mg | PREFILLED_SYRINGE | Freq: Once | INTRAMUSCULAR | Status: AC
  Administered 2023-01-22: 234 mg via INTRAMUSCULAR

## 2023-01-21 MED ORDER — PALIPERIDONE PALMITATE ER 156 MG/ML IM SUSY: 156.0000 mg | PREFILLED_SYRINGE | INTRAMUSCULAR | Status: DC

## 2023-01-21 MED ORDER — PALIPERIDONE PALMITATE ER 156 MG/ML IM SUSY
156.0000 mg | PREFILLED_SYRINGE | INTRAMUSCULAR | Status: DC
  Administered 2023-01-27: 156 mg via INTRAMUSCULAR

## 2023-01-21 NOTE — Progress Notes (Signed)
[facility] MD Progress Note  01/21/2023 2:59 PM Megan Monroe  MRN:  [id number] Subjective:   In brief; Megan Monroe is a 27 year old female with history for schizophrenia, polysubstance use who presented to the [facility] under IVC with worsening psychosis and behavioral problems. Collateral information obtained from patient's grandmother patient has diagnosis of schizophrenia noncompliant with her medications made suicidal statement to her grandmother that she want to die.  Progress in the last 24 hrs: Staff reports that the patient is making slow improvement.  She is a lot more communicative and according to the staff has not required any as needed medications other than trazodone 50 mg at night and hydroxyzine 25 mg at bedtime.  She slept fairly well.  She denies side effects and has been responding positively to the milieu although she still refuses to provide permission for collaterals and also refuses to attend all groups.  On examination today: The patient is alert and appears to be oriented x 3. She maintained good eye contact.  Her speech was slow but coherent and clear.  She did respond to questions appropriately but then would start making gestures and appeared to act somewhat bizarre in her presentation.  She did state that she heard voices but they are very minimal.  Her affect is somewhat blunted.  She denied any history of reported visual hallucinations or delusions.  Her thought blocking is improving. Still awaiting contact with family.  Principal Problem: Schizophrenia ([place]) Diagnosis: Principal Problem:   Schizophrenia ([place]) Active Problems:   Stimulant use disorder   Cannabis use disorder, mild, abuse  Total Time spent with patient: 30 minutes  Past Psychiatric History:   Prior Psychiatric diagnoses: Schizophrenia Past Psychiatric Hospitalizations: [facility] 2021 and [facility] 2023   History of self mutilation: None noted Past suicide  attempts: None noted, patient denies Past history of HI, violent or aggressive behavior: None noted   Past Psychiatric medications trials: Kirt Boys as noted above but noncompliant History of ECT/TMS: None noted   Outpatient psychiatric Follow up: Noncompliant Prior Outpatient Therapy: Unknown  Past Medical History:  Past Medical History:  Diagnosis Date   Exercise-induced asthma    No past surgical history on file. Family History:  Family History  Problem Relation Age of Onset   Hypertension Father    Diabetes Maternal Grandmother    Heart attack Cousin        Early 63s   Family Psychiatric  History: unknown Social History:  Social History   Substance and Sexual Activity  Alcohol Use No     Social History   Substance and Sexual Activity  Drug Use No    Social History   Socioeconomic History   Marital status: Single    Spouse name: Not on file   Number of children: Not on file   Years of education: Not on file   Highest education level: Not on file  Occupational History   Not on file  Tobacco Use   Smoking status: Never   Smokeless tobacco: Never  Vaping Use   Vaping Use: Never used  Substance and Sexual Activity   Alcohol use: No   Drug use: No   Sexual activity: Never  Other Topics Concern   Not on file  Social History Narrative   Pt lives with mother, father (or step father, this information was not clarified to me) and grandmother.   Social Determinants of Health   Financial Resource Strain: Not on file  Food Insecurity: Not on  file  Transportation Needs: Not on file  Physical Activity: Not on file  Stress: Not on file  Social Connections: Not on file   Additional Social History:   Current Medications: Current Facility-Administered Medications  Medication Dose Route Frequency Provider Last Rate Last Admin   acetaminophen (TYLENOL) tablet 650 mg  650 mg Oral Q6H PRN Tharon Aquas, NP   650 mg at 01/14/23 0752   alum & mag  hydroxide-simeth (MAALOX/MYLANTA) 200-200-20 MG/5ML suspension 30 mL  30 mL Oral Q4H PRN Tharon Aquas, NP       diphenhydrAMINE (BENADRYL) capsule 50 mg  50 mg Oral TID PRN Tharon Aquas, NP       Or   diphenhydrAMINE (BENADRYL) injection 50 mg  50 mg Intramuscular TID PRN Tharon Aquas, NP       feeding supplement (ENSURE ENLIVE / ENSURE PLUS) liquid 237 mL  237 mL Oral BID BM Massengill, Ovid Curd, MD   237 mL at 01/21/23 1321   haloperidol (HALDOL) tablet 5 mg  5 mg Oral TID PRN Tharon Aquas, NP       Or   haloperidol lactate (HALDOL) injection 5 mg  5 mg Intramuscular TID PRN Tharon Aquas, NP       hydrOXYzine (ATARAX) tablet 25 mg  25 mg Oral TID PRN Tharon Aquas, NP   25 mg at 01/21/23 0744   LORazepam (ATIVAN) tablet 2 mg  2 mg Oral TID PRN Tharon Aquas, NP       Or   LORazepam (ATIVAN) injection 2 mg  2 mg Intramuscular TID PRN Tharon Aquas, NP       magnesium hydroxide (MILK OF MAGNESIA) suspension 30 mL  30 mL Oral Daily PRN Tharon Aquas, NP       Derrill Memo ON 01/27/2023] paliperidone (INVEGA SUSTENNA) injection 156 mg  156 mg Intramuscular Q28 days Ranae Palms, MD       Derrill Memo ON 01/22/2023] paliperidone (INVEGA SUSTENNA) injection 234 mg  234 mg Intramuscular Once Ranae Palms, MD       traZODone (DESYREL) tablet 50 mg  50 mg Oral QHS PRN Tharon Aquas, NP   50 mg at 01/18/23 2102    Lab Results:  No results found for this or any previous visit (from the past 11 hour(s)).   Blood Alcohol level:  Lab Results  Component Value Date   ETH <10 01/12/2023   ETH <10 99991111    Metabolic Disorder Labs: Lab Results  Component Value Date   HGBA1C 5.7 (H) 01/12/2023   MPG 117 01/12/2023   Lab Results  Component Value Date   PROLACTIN 5.5 01/12/2023   Lab Results  Component Value Date   CHOL 107 01/12/2023   TRIG 61 01/12/2023   HDL 30 (L) 01/12/2023   CHOLHDL 3.6 01/12/2023   VLDL 12 01/12/2023    LDLCALC 65 01/12/2023   LDLCALC 107 08/13/2012    Physical Findings: AIMS: Facial and Oral Movements Muscles of Facial Expression: None, normal Lips and Perioral Area: None, normal Jaw: None, normal Tongue: None, normal,Extremity Movements Upper (arms, wrists, hands, fingers): None, normal Lower (legs, knees, ankles, toes): None, normal, Trunk Movements Neck, shoulders, hips: None, normal, Overall Severity Severity of abnormal movements (highest score from questions above): None, normal Incapacitation due to abnormal movements: None, normal Patient's awareness of abnormal movements (rate only patient's report): No Awareness, Dental Status Current problems with teeth and/or dentures?: No Does patient usually wear dentures?: No  CIWA:    COWS:     Musculoskeletal: Strength & Muscle Tone: within normal limits Gait & Station:  unable to assess as pt was lying in bed Patient leans: N/A  Psychiatric Specialty Exam:  Presentation  General Appearance: Appropriate for Environment; Fairly Groomed Eye Contact: Poor Speech: minimal, mumbling Speech Volume: low Handedness: Right  Mood and Affect  Mood: "fine" Affect: Congruent  Thought Process  Thought Processes:Coherent Descriptions of Associations: loose Orientation: unable to assess due to poor cooperation Thought Content: delusions History of Schizophrenia/Schizoaffective disorder:Yes Duration of Psychotic Symptoms:Greater than six months Hallucinations: responds to internal stimuli Ideas of Reference: unable to assess Suicidal Thoughts: patient denies current SI but reported having it yesterday Homicidal Thoughts: patient denies  Sensorium  Memory: unable to assess due to being uncooperative Judgment:Fair Insight: Poor  Executive Functions  Concentration: Fair Attention Span:Fair Recall:\Fair Fund of Knowledge:Fair Language:Fair  Psychomotor Activity  Psychomotor Activity: slowed  Assets   Assets: Resilience; Social Support; Desire for Improvement   Sleep  Sleep: Fair   Physical Exam: Physical Exam Vitals and nursing note reviewed.  HENT:     Head: Normocephalic and atraumatic.  Eyes:     Conjunctiva/sclera: Conjunctivae normal.     Pupils: Pupils are equal, round, and reactive to light.  Pulmonary:     Effort: Pulmonary effort is normal.  Abdominal:     General: Abdomen is flat.  Skin:    General: Skin is warm.    Review of Systems  Constitutional: Negative.   HENT: Negative.    Eyes: Negative.   Respiratory: Negative.    Cardiovascular: Negative.   Gastrointestinal: Negative.   Neurological: Negative.   All other systems reviewed and are negative.  Blood pressure 120/85, pulse (!) 103, temperature 98.2 F (36.8 C), temperature source Oral, resp. rate 16, height 5\' 3"  (1.6 m), weight 50.3 kg, SpO2 100 %. Body mass index is 19.66 kg/m.   Treatment Plan Summary: Daily contact with patient to assess and evaluate symptoms and progress in treatment and Medication management. The patient is doing slightly better than she did yesterday. Her Invega was increased to 6 mg yesterday and she tolerated well. NO EPS signs. She is still severely psychotic. Consider titration of Invega further. If patient displays behavioral issues and agitation, we'll consider adding Haldol or Zyprexa.    Principal Diagnosis: Schizophrenia ([place]) Diagnosis:  Principal Problem:   Schizophrenia ([place])     PLAN: Safety and Monitoring:             -- Involuntary admission to inpatient psychiatric unit for safety, stabilization and treatment             -- Daily contact with patient to assess and evaluate symptoms and progress in treatment             -- Patient's case to be discussed in multi-disciplinary team meeting             -- Observation Level : q15 minute checks             -- Vital signs:  q12 hours             -- Precautions: suicide, elopement, and assault   2.  Medications:  Discontinue Invega 9 mg a day. Begin Invega Sustenna 234 mg IM tomorrow.  Then she will receive 156 mg IM on April 2 and then every 4 weeks thereafter.  As needed protocol for agitation and aggression             Atarax 25 mg 3 times daily as needed for anxiety             Trazodone 50 mg at bedtime as needed for sleep             Ceftriaxone 500 mg IM once for gonorrhea/STD, given 01/14/23               The risks/benefits/side-effects/alternatives to this medication were discussed in detail with the patient and time was given for questions. The patient consents to medication trial.                -- Metabolic profile and EKG monitoring obtained while on an atypical antipsychotic (BMI: Lipid Panel: HbgA1c: QTc:)                            3. Labs Reviewed: CMP no significant abnormalities noted, CBC no significant abnormalities noted, lipid profile no significant abnormalities noted, pregnancy test negative, hemoglobin A1c 5.7, TSH within normal limit, RPR reactive titer 1:2, but sent for confirmatory testing, gonorrhea positive, chlamydia negative, UDS positive to amphetamine, cocaine and marijuana, EKG 3/18 NSR QTc 439  HIV negative      Lab ordered: Confirmatory testing RPR   4. Group and Therapy: -- Encouraged patient to participate in unit milieu and in scheduled group therapies              --Substance Use counseling: As patient clears cognitively will counsel regarding need to abstain completely from illicit drug use after discharge   5. Discharge Planning:              -- Social work and case management to assist with discharge planning and identification of hospital follow-up needs prior to discharge             -- Estimated LOS: Possible discharge by next Tuesday.             -- Discharge Concerns: Need to establish a safety plan; Medication compliance and effectiveness             -- Discharge Goals: Return home with outpatient referrals for  mental health follow-up including medication management/psychotherapy     The patient is agreeable with the medication plan, as above. We will monitor the patient's response to pharmacologic treatment, and adjust medications as necessary. Patient is encouraged to participate in group therapy while admitted to the psychiatric unit. We will address other chronic and acute stressors, which contributed to the patient's increased psychosis, in order to reduce the risk of self-harm at discharge.     Physician Treatment Plan for Primary Diagnosis: Schizophrenia ([place]) Long Term Goal(s): Improvement in symptoms so as ready for discharge   Short Term Goals: Ability to identify changes in lifestyle to reduce recurrence of condition will improve, Ability to verbalize feelings will improve, Ability to disclose and discuss suicidal ideas, and Ability to demonstrate self-control will improve     I certify that inpatient services furnished can reasonably be expected to improve the patient's condition.      Ranae Palms, MD 01/21/2023, 2:59 PMPatient ID: Oretha Milch, female   DOB: [birth year], 27 y.o.   MRN: [id number] Patient ID: Malayna Test, female   DOB: 10-15-1996, 27 y.o.   MRN: [id number]

## 2023-01-21 NOTE — [facility] Counselor (Signed)
[facility] LCSW Progress Note   01/21/2023    2:21 PM  Megan Monroe   [id number]   Type of Contact and Topic:  Consents  CSW attempted to get consents of someone that social work can speak to for safety planning.  Patient continues to decline consents.  Patient also declined follow up.     Signed:  Riki Altes MSW, LCSW, LCAS 01/21/2023 2:21 PM

## 2023-01-21 NOTE — Progress Notes (Signed)
Did not attend group 

## 2023-01-21 NOTE — Group Note (Signed)
Date:  01/21/2023 Time:  9:37 PM  Group Topic/Focus:  Wrap-Up Group:   The focus of this group is to help patients review their daily goal of treatment and discuss progress on daily workbooks.    Participation Level:  Active  Participation Quality:  Appropriate  Affect:  Appropriate  Cognitive:  Appropriate  Insight: Appropriate  Engagement in Group:  Engaged  Modes of Intervention:  Education and Exploration  Additional Comments:  Patient attended and participated in group tonight. She reports that the significant thing about the day was quiter  Megan Monroe 01/21/2023, 9:37 PM

## 2023-01-21 NOTE — Group Note (Signed)
Recreation Therapy Group Note   Group Topic:Healthy Decision Making  Group Date: 01/21/2023 Start Time: 1020 End Time: 1105 Facilitators: Breaunna Gottlieb-McCall, LRT,CTRS Location: [address] Dayroom   Goal Area(s) Addresses:  Patient will effectively work with peer towards shared goal.  Patient will identify skills used to make activity successful.  Patient will identify how skills used during activity can be applied to reach post d/c goals.   Group Description: [employer]. In teams of 5-6, patients were given 11 craft pipe cleaners. Using the materials provided, patients were instructed to compete again the opposing team(s) to build the tallest free-standing structure from floor level. The activity was timed; difficulty increased by Probation officer as Pharmacist, hospital continued.  Systematically resources were removed with additional directions for example, placing one arm behind their back, working in silence, and shape stipulations. LRT facilitated post-activity discussion reviewing team processes and necessary communication skills involved in completion. Patients were encouraged to reflect how the skills utilized, or not utilized, in this activity can be incorporated to positively impact support systems post discharge.   Affect/Mood: Labile   Participation Level: Minimal   Participation Quality: Independent   Behavior: Paranoid   Speech/Thought Process: Relevant   Insight: Fair   Judgement: Fair    Modes of Intervention: Activity and Music   Patient Response to Interventions:  Attentive and Receptive   Education Outcome:  Acknowledges education and In group clarification offered    Clinical Observations/Individualized Feedback: Pt was bright and in her own world.  Pt would respond when prompted.  Pt was appropriate and identified food, weapons and medication from injuries as things she would take with her.  Once pt gave her answer, pt went back daydreaming and in  her own world.      Plan: Continue to engage patient in RT group sessions 2-3x/week.   Fantasia Jinkins-McCall, LRT,CTRS 01/21/2023 1:02 PM

## 2023-01-21 NOTE — Group Note (Signed)
Date:  01/21/2023 Time:  9:37 AM  Group Topic/Focus:  Goals Group:   The focus of this group is to help patients establish daily goals to achieve during treatment and discuss how the patient can incorporate goal setting into their daily lives to aide in recovery. Orientation:   The focus of this group is to educate the patient on the purpose and policies of crisis stabilization and provide a format to answer questions about their admission.  The group details unit policies and expectations of patients while admitted.    Participation Level:  Active  Participation Quality:  Appropriate  Affect:  Appropriate  Cognitive:  Appropriate  Insight: Appropriate  Engagement in Group:  Engaged  Modes of Intervention:  Discussion  Additional Comments:  Pt wants to focus on going back to school for healthcare  Garvin Fila 01/21/2023, 9:37 AMPt

## 2023-01-21 NOTE — Progress Notes (Signed)
   01/21/23 2000  Psych Admission Type (Psych Patients Only)  Admission Status Involuntary  Psychosocial Assessment  Patient Complaints None  Eye Contact Brief  Facial Expression Animated  Affect Appropriate to circumstance  Speech Soft  Interaction Assertive  Motor Activity Fidgety  Appearance/Hygiene Disheveled  Behavior Characteristics Cooperative  Mood Preoccupied;Pleasant  Aggressive Behavior  Effect No apparent injury  Thought Process  Coherency Concrete thinking;Disorganized  Content Preoccupation;Delusions  Delusions None reported or observed  Perception WDL  Hallucination None reported or observed  Judgment Limited  Confusion Mild  Danger to Self  Current suicidal ideation? Denies  Danger to Others  Danger to Others None reported or observed

## 2023-01-22 DIAGNOSIS — F202 Catatonic schizophrenia: Secondary | ICD-10-CM | POA: Diagnosis not present

## 2023-01-22 NOTE — Group Note (Signed)
Date:  01/22/2023 Time:  6:30 PM  Group Topic/Focus:  Goals Group:   The focus of this group is to help patients establish daily goals to achieve during treatment and discuss how the patient can incorporate goal setting into their daily lives to aide in recovery. Orientation:   The focus of this group is to educate the patient on the purpose and policies of crisis stabilization and provide a format to answer questions about their admission.  The group details unit policies and expectations of patients while admitted.    Participation Level:  Active  Participation Quality:  Appropriate  Affect:  Appropriate  Cognitive:  Appropriate  Insight: Appropriate  Engagement in Group:  Engaged  Modes of Intervention:  Discussion  Additional Comments:  Pt wants to go back to school  Garvin Fila 01/22/2023, 6:30 PM

## 2023-01-22 NOTE — Group Note (Signed)
Date:  01/22/2023 Time:  9:53 PM  Group Topic/Focus:  Wrap-Up Group:   The focus of this group is to help patients review their daily goal of treatment and discuss progress on daily workbooks.    Participation Level:  Active  Participation Quality:  Appropriate  Affect:  Appropriate  Cognitive:  Appropriate  Insight: Appropriate  Engagement in Group:  Engaged  Modes of Intervention:  Education and Exploration  Additional Comments:  Patient attended and participated in group tonight. She reports that her day was a 10 cecause she was calm and relax.  She like her eyes.  Salley Scarlet [facility] 01/22/2023, 9:53 PM

## 2023-01-22 NOTE — Progress Notes (Signed)
[facility] MD Progress Note  01/22/2023 10:35 AM Megan Monroe  MRN:  [id number] Subjective:   In brief; Megan Monroe is a 27 year old female with history for schizophrenia, polysubstance use who presented to the [facility] under IVC with worsening psychosis and behavioral problems. Collateral information obtained from patient's grandmother patient has diagnosis of schizophrenia noncompliant with her medications made suicidal statement to her grandmother that she want to die.  Progress in the last 24 hrs: Staff reports that the patient continues to have some bizarre movements and at times may be talking to herself but she has been a lot more cooperative and compliant and shows improvement.  She is communicating more and is less catatonic.  She will receive her first dose of [place] today and is scheduled for her next dose in 4 days.  She received as needed trazodone for sleep.  She refused to attend groups.  On examination today: The patient is alert and appears to be oriented x 3.  She maintained fair to good eye contact.  Her speech is coherent with some tangentiality and occasional bizarre gestures with her hands.  However she denies any active SI/HI/AVH.  She is contracting for safety and has agreed to take the long-acting injectable.  Discharge planning in progress with possible discharge after the second dose of [place].   Principal Problem: Schizophrenia ([place]) Diagnosis: Principal Problem:   Schizophrenia ([place]) Active Problems:   Stimulant use disorder   Cannabis use disorder, mild, abuse  Total Time spent with patient: 30 minutes  Past Psychiatric History:   Prior Psychiatric diagnoses: Schizophrenia Past Psychiatric Hospitalizations: [facility] 2021 and [facility]   History of self mutilation: None noted Past suicide attempts: None noted, patient denies Past history of HI, violent or aggressive behavior: None noted   Past  Psychiatric medications trials: Kirt Boys as noted above but noncompliant History of ECT/TMS: None noted   Outpatient psychiatric Follow up: Noncompliant Prior Outpatient Therapy: Unknown  Past Medical History:  Past Medical History:  Diagnosis Date   Exercise-induced asthma    No past surgical history on file. Family History:  Family History  Problem Relation Age of Onset   Hypertension Father    Diabetes Maternal Grandmother    Heart attack Cousin        Early 63s   Family Psychiatric  History: unknown Social History:  Social History   Substance and Sexual Activity  Alcohol Use No     Social History   Substance and Sexual Activity  Drug Use No    Social History   Socioeconomic History   Marital status: Single    Spouse name: Not on file   Number of children: Not on file   Years of education: Not on file   Highest education level: Not on file  Occupational History   Not on file  Tobacco Use   Smoking status: Never   Smokeless tobacco: Never  Vaping Use   Vaping Use: Never used  Substance and Sexual Activity   Alcohol use: No   Drug use: No   Sexual activity: Never  Other Topics Concern   Not on file  Social History Narrative   Pt lives with mother, father (or step father, this information was not clarified to me) and grandmother.   Social Determinants of Health   Financial Resource Strain: Not on file  Food Insecurity: Not on file  Transportation Needs: Not on file  Physical Activity: Not on file  Stress: Not  on file  Social Connections: Not on file   Additional Social History:   Current Medications: Current Facility-Administered Medications  Medication Dose Route Frequency Provider Last Rate Last Admin   acetaminophen (TYLENOL) tablet 650 mg  650 mg Oral Q6H PRN Tharon Aquas, NP   650 mg at 01/14/23 0752   alum & mag hydroxide-simeth (MAALOX/MYLANTA) 200-200-20 MG/5ML suspension 30 mL  30 mL Oral Q4H PRN Tharon Aquas, NP        diphenhydrAMINE (BENADRYL) capsule 50 mg  50 mg Oral TID PRN Tharon Aquas, NP       Or   diphenhydrAMINE (BENADRYL) injection 50 mg  50 mg Intramuscular TID PRN Tharon Aquas, NP       feeding supplement (ENSURE ENLIVE / ENSURE PLUS) liquid 237 mL  237 mL Oral BID BM Massengill, Ovid Curd, MD   237 mL at 01/21/23 1321   haloperidol (HALDOL) tablet 5 mg  5 mg Oral TID PRN Tharon Aquas, NP       Or   haloperidol lactate (HALDOL) injection 5 mg  5 mg Intramuscular TID PRN Tharon Aquas, NP       hydrOXYzine (ATARAX) tablet 25 mg  25 mg Oral TID PRN Tharon Aquas, NP   25 mg at 01/21/23 0744   LORazepam (ATIVAN) tablet 2 mg  2 mg Oral TID PRN Tharon Aquas, NP       Or   LORazepam (ATIVAN) injection 2 mg  2 mg Intramuscular TID PRN Tharon Aquas, NP       magnesium hydroxide (MILK OF MAGNESIA) suspension 30 mL  30 mL Oral Daily PRN Tharon Aquas, NP       Derrill Memo ON 01/27/2023] paliperidone (INVEGA SUSTENNA) injection 156 mg  156 mg Intramuscular Q28 days Ranae Palms, MD       traZODone (DESYREL) tablet 50 mg  50 mg Oral QHS PRN Tharon Aquas, NP   50 mg at 01/18/23 2102    Lab Results:  No results found for this or any previous visit (from the past 70 hour(s)).   Blood Alcohol level:  Lab Results  Component Value Date   ETH <10 01/12/2023   ETH <10 99991111    Metabolic Disorder Labs: Lab Results  Component Value Date   HGBA1C 5.7 (H) 01/12/2023   MPG 117 01/12/2023   Lab Results  Component Value Date   PROLACTIN 5.5 01/12/2023   Lab Results  Component Value Date   CHOL 107 01/12/2023   TRIG 61 01/12/2023   HDL 30 (L) 01/12/2023   CHOLHDL 3.6 01/12/2023   VLDL 12 01/12/2023   LDLCALC 65 01/12/2023   LDLCALC 107 08/13/2012    Physical Findings: AIMS: Facial and Oral Movements Muscles of Facial Expression: None, normal Lips and Perioral Area: None, normal Jaw: None, normal Tongue: None, normal,Extremity  Movements Upper (arms, wrists, hands, fingers): None, normal Lower (legs, knees, ankles, toes): None, normal, Trunk Movements Neck, shoulders, hips: None, normal, Overall Severity Severity of abnormal movements (highest score from questions above): None, normal Incapacitation due to abnormal movements: None, normal Patient's awareness of abnormal movements (rate only patient's report): No Awareness, Dental Status Current problems with teeth and/or dentures?: No Does patient usually wear dentures?: No  CIWA:    COWS:     Musculoskeletal: Strength & Muscle Tone: within normal limits Gait & Station:  unable to assess as pt was lying in bed Patient leans: N/A  Psychiatric Specialty Exam:  Presentation  General Appearance: Casual Eye Contact: Poor Speech: minimal, mumbling Speech Volume: low Handedness: Right  Mood and Affect  Mood: "fine" Affect: Congruent  Thought Process  Thought Processes:Disorganized Descriptions of Associations: loose Orientation: unable to assess due to poor cooperation Thought Content: delusions History of Schizophrenia/Schizoaffective disorder:Yes Duration of Psychotic Symptoms:Greater than six months Hallucinations: responds to internal stimuli Ideas of Reference: unable to assess Suicidal Thoughts: patient denies current SI but reported having it yesterday Homicidal Thoughts: patient denies  Sensorium  Memory: unable to assess due to being uncooperative Judgment:Fair Insight: Poor  Executive Functions  Concentration: Fair Attention Span:Fair Recall:\Fair Fund of Knowledge:Fair Language:Fair  Psychomotor Activity  Psychomotor Activity: slowed  Assets  Assets: Desire for Improvement; Resilience   Sleep  Sleep: Fair   Physical Exam: Physical Exam Vitals and nursing note reviewed.  HENT:     Head: Normocephalic and atraumatic.  Eyes:     Conjunctiva/sclera: Conjunctivae normal.     Pupils: Pupils are equal, round, and  reactive to light.  Pulmonary:     Effort: Pulmonary effort is normal.  Abdominal:     General: Abdomen is flat.  Skin:    General: Skin is warm.    Review of Systems  Constitutional: Negative.   HENT: Negative.    Eyes: Negative.   Respiratory: Negative.    Cardiovascular: Negative.   Gastrointestinal: Negative.   Neurological: Negative.   All other systems reviewed and are negative.  Blood pressure 107/61, pulse 93, temperature 98.7 F (37.1 C), temperature source Oral, resp. rate 16, height 5\' 3"  (1.6 m), weight 50.3 kg, SpO2 (!) 70 %. Body mass index is 19.66 kg/m.   Treatment Plan Summary: Daily contact with patient to assess and evaluate symptoms and progress in treatment and Medication management. The patient is doing slightly better than she did yesterday. Her Invega was increased to 6 mg yesterday and she tolerated well. NO EPS signs. She is still severely psychotic. Consider titration of Invega further. If patient displays behavioral issues and agitation, we'll consider adding Haldol or Zyprexa.    Principal Diagnosis: Schizophrenia ([place]) Diagnosis:  Principal Problem:   Schizophrenia ([place])     PLAN: Safety and Monitoring:             -- Involuntary admission to inpatient psychiatric unit for safety, stabilization and treatment             -- Daily contact with patient to assess and evaluate symptoms and progress in treatment             -- Patient's case to be discussed in multi-disciplinary team meeting             -- Observation Level : q15 minute checks             -- Vital signs:  q12 hours             -- Precautions: suicide, elopement, and assault   2. Medications:  Discontinued Invega 9 mg a day. Invega S[place]enna 234 mg IM today, then she will receive 156 mg IM on April 2 and then every 4 weeks thereafter.                      As needed protocol for agitation and aggression             Atarax 25 mg 3 times daily as needed for anxiety              Trazodone 50 mg  at bedtime as needed for sleep             Ceftriaxone 500 mg IM once for gonorrhea/STD, given 01/14/23               The risks/benefits/side-effects/alternatives to this medication were discussed in detail with the patient and time was given for questions. The patient consents to medication trial.                -- Metabolic profile and EKG monitoring obtained while on an atypical antipsychotic (BMI: Lipid Panel: HbgA1c: QTc:)                            3. Labs Reviewed: CMP no significant abnormalities noted, CBC no significant abnormalities noted, lipid profile no significant abnormalities noted, pregnancy test negative, hemoglobin A1c 5.7, TSH within normal limit, RPR reactive titer 1:2, but sent for confirmatory testing, gonorrhea positive, chlamydia negative, UDS positive to amphetamine, cocaine and marijuana, EKG 3/18 NSR QTc 439  HIV negative      Lab ordered: Confirmatory testing RPR   4. Group and Therapy: -- Encouraged patient to participate in unit milieu and in scheduled group therapies              --Substance Use counseling: As patient clears cognitively will counsel regarding need to abstain completely from illicit drug use after discharge   5. Discharge Planning:              -- Social work and case management to assist with discharge planning and identification of hospital follow-up needs prior to discharge             -- Estimated LOS: Possible discharge by next Tuesday4/11/2022             -- Discharge Concerns: Need to establish a safety plan; Medication compliance and effectiveness             -- Discharge Goals: Return home with outpatient referrals for mental health follow-up including medication management/psychotherapy     The patient is agreeable with the medication plan, as above. We will monitor the patient's response to pharmacologic treatment, and adjust medications as necessary. Patient is encouraged to participate in group therapy while  admitted to the psychiatric unit. We will address other chronic and acute stressors, which contributed to the patient's increased psychosis, in order to reduce the risk of self-harm at discharge.     Physician Treatment Plan for Primary Diagnosis: Schizophrenia ([place]) Long Term Goal(s): Improvement in symptoms so as ready for discharge   Short Term Goals: Ability to identify changes in lifestyle to reduce recurrence of condition will improve, Ability to verbalize feelings will improve, Ability to disclose and discuss suicidal ideas, and Ability to demonstrate self-control will improve     I certify that inpatient services furnished can reasonably be expected to improve the patient's condition.      Ranae Palms, MD 01/22/2023, 10:35 AMPatient ID: Megan Monroe, female   DOB: 07/31/1996, 27 y.o.   MRN: [id number] Patient ID: Megan Monroe, female   DOB: [birth year], 27 y.o.   MRN: [id number] Patient ID: Megan Monroe, female   DOB: [birth year], 27 y.o.   MRN: [id number]

## 2023-01-22 NOTE — Progress Notes (Signed)
   01/22/23 0600  15 Minute Checks  Location Bedroom  Visual Appearance Calm  Behavior Sleeping  Sleep (Behavioral Health Patients Only)  Calculate sleep? (Click Yes once per 24 hr at 0600 safety check) Yes  Documented sleep last 24 hours 7.75

## 2023-01-22 NOTE — Progress Notes (Signed)
   01/22/23 2100  Psych Admission Type (Psych Patients Only)  Admission Status Involuntary  Psychosocial Assessment  Patient Complaints None  Eye Contact Brief  Facial Expression Animated  Affect Appropriate to circumstance  Speech Soft;Tangential  Interaction Assertive  Motor Activity Fidgety  Appearance/Hygiene Improved;Disheveled  Behavior Characteristics Cooperative  Mood Preoccupied;Pleasant  Aggressive Behavior  Effect No apparent injury  Thought Process  Coherency Concrete thinking  Content Preoccupation  Delusions None reported or observed  Perception Hallucinations  Hallucination Auditory  Judgment Limited  Confusion Mild  Danger to Self  Current suicidal ideation? Denies  Danger to Others  Danger to Others None reported or observed

## 2023-01-22 NOTE — Group Note (Signed)
Recreation Therapy Group Note   Group Topic:Goal Setting  Group Date: 01/22/2023 Start Time: 1000 End Time: 1035 Facilitators: Rigoberto Repass-McCall, LRT,CTRS Location: [address] Dayroom   Goal Area(s) Addresses:  Patient will successfully identify goals they was to achieve. Patient will discuss what prevents them from reaching their goals.   Group Description: DREAM. Patients were given a worksheet with dream spelled in large block letters.  Patients were to write their goals inside inside of the letters that spell out dream.  Patients were to then write the things that prevent them from reaching their goals outside of the letters.  Patients were provided markers to complete the activity.   Affect/Mood: Labile   Participation Level: Minimal   Participation Quality: Independent   Behavior: Hallucinating   Speech/Thought Process: Delusional   Insight: Lacking   Judgement: Lacking    Modes of Intervention: Art   Patient Response to Interventions:  Receptive   Education Outcome:  Acknowledges education and In group clarification offered    Clinical Observations/Individualized Feedback: Pt appeared to be in her own word but engaged when prompted.  When asked what her goals were, pt stated "I have no idea". Pt was randomly laughing during group.     Plan: Continue to engage patient in RT group sessions 2-3x/week.   Zamiya Dillard-McCall, LRT,CTRS 01/22/2023 12:39 PM

## 2023-01-22 NOTE — Group Note (Signed)
Occupational Therapy Group Note  Group Topic:Coping Skills  Group Date: 01/22/2023 Start Time: 1400 End Time: 51 Facilitators: Brantley Stage, OT   Group Description: Group encouraged increased engagement and participation through discussion and activity focused on "Coping Ahead." Patients were split up into teams and selected a card from a stack of positive coping strategies. Patients were instructed to act out/charade the coping skill for other peers to guess and receive points for their team. Discussion followed with a focus on identifying additional positive coping strategies and patients shared how they were going to cope ahead over the weekend while continuing hospitalization stay.  Therapeutic Goal(s): Identify positive vs negative coping strategies. Identify coping skills to be used during hospitalization vs coping skills outside of hospital/at home Increase participation in therapeutic group environment and promote engagement in treatment   Participation Level: Engaged   Participation Quality: Moderate Cues   Behavior: Cooperative   Speech/Thought Process: Disorganized   Affect/Mood: Flat   Insight: Limited   Judgement: Limited   Individualization: pt was passively engaged in their participation of group discussion/activity. New skills were identified  Modes of Intervention: Education  Patient Response to Interventions:  Attentive   Plan: Continue to engage patient in OT groups 2 - 3x/week.  01/22/2023  Brantley Stage, OT  [name], OT

## 2023-01-22 NOTE — Group Note (Signed)
Date:  01/22/2023 Time:  6:41 PM  Group Topic/Focus:  Wellness Toolbox:   The focus of this group is to discuss various aspects of wellness, balancing those aspects and exploring ways to increase the ability to experience wellness.  Patients will create a wellness toolbox for use upon discharge.    Participation Level:  Active  Participation Quality:  Appropriate  Affect:  Appropriate  Cognitive:  Appropriate  Insight: Appropriate  Engagement in Group:  Engaged  Modes of Intervention:  Activity  Additional Comments:  Pt was engaged in [place] with peers  Garvin Fila 01/22/2023, 6:41 PM

## 2023-01-23 ENCOUNTER — Encounter (HOSPITAL_COMMUNITY): Payer: Self-pay

## 2023-01-23 DIAGNOSIS — F202 Catatonic schizophrenia: Secondary | ICD-10-CM | POA: Diagnosis not present

## 2023-01-23 MED ORDER — TRAZODONE HCL 100 MG PO TABS
100.0000 mg | ORAL_TABLET | Freq: Every evening | ORAL | Status: DC | PRN
  Administered 2023-01-23 – 2023-01-27 (×5): 100 mg via ORAL
  Filled 2023-01-23 (×5): qty 1

## 2023-01-23 NOTE — BH IP Treatment Plan (Signed)
Interdisciplinary Treatment and Diagnostic Plan Update  01/23/2023 Time of Session: [id number] Megan Monroe MRN: [id number]  Principal Diagnosis: Schizophrenia  Secondary Diagnoses: Principal Problem:   Schizophrenia ([place]) Active Problems:   Stimulant use disorder   Cannabis use disorder, mild, abuse   Current Medications:  Current Facility-Administered Medications  Medication Dose Route Frequency Provider Last Rate Last Admin   acetaminophen (TYLENOL) tablet 650 mg  650 mg Oral Q6H PRN Tharon Aquas, NP   650 mg at 01/14/23 0752   alum & mag hydroxide-simeth (MAALOX/MYLANTA) 200-200-20 MG/5ML suspension 30 mL  30 mL Oral Q4H PRN Tharon Aquas, NP       diphenhydrAMINE (BENADRYL) capsule 50 mg  50 mg Oral TID PRN Tharon Aquas, NP       Or   diphenhydrAMINE (BENADRYL) injection 50 mg  50 mg Intramuscular TID PRN Tharon Aquas, NP       feeding supplement (ENSURE ENLIVE / ENSURE PLUS) liquid 237 mL  237 mL Oral BID BM Massengill, Ovid Curd, MD   237 mL at 01/23/23 1122   haloperidol (HALDOL) tablet 5 mg  5 mg Oral TID PRN Tharon Aquas, NP       Or   haloperidol lactate (HALDOL) injection 5 mg  5 mg Intramuscular TID PRN Tharon Aquas, NP       hydrOXYzine (ATARAX) tablet 25 mg  25 mg Oral TID PRN Tharon Aquas, NP   25 mg at 01/21/23 0744   LORazepam (ATIVAN) tablet 2 mg  2 mg Oral TID PRN Tharon Aquas, NP       Or   LORazepam (ATIVAN) injection 2 mg  2 mg Intramuscular TID PRN Tharon Aquas, NP       magnesium hydroxide (MILK OF MAGNESIA) suspension 30 mL  30 mL Oral Daily PRN Tharon Aquas, NP       Derrill Memo ON 01/27/2023] paliperidone (INVEGA SUSTENNA) injection 156 mg  156 mg Intramuscular Q28 days Ranae Palms, MD       traZODone (DESYREL) tablet 100 mg  100 mg Oral QHS PRN Ranae Palms, MD       PTA Medications: No medications prior to admission.    Patient Stressors:    Patient Strengths:    Treatment  Modalities: Medication Management, Group therapy, Case management,  1 to 1 session with clinician, Psychoeducation, Recreational therapy.   Physician Treatment Plan for Primary Diagnosis: Schizophrenia Long Term Goal(s): Improvement in symptoms so as ready for discharge   Short Term Goals: Ability to identify changes in lifestyle to reduce recurrence of condition will improve Ability to verbalize feelings will improve Ability to disclose and discuss suicidal ideas Ability to demonstrate self-control will improve  Medication Management: Evaluate patient's response, side effects, and tolerance of medication regimen.  Therapeutic Interventions: 1 to 1 sessions, Unit Group sessions and Medication administration.  Evaluation of Outcomes: Progressing  Physician Treatment Plan for Secondary Diagnosis: Principal Problem:   Schizophrenia ([place]) Active Problems:   Stimulant use disorder   Cannabis use disorder, mild, abuse  Long Term Goal(s): Improvement in symptoms so as ready for discharge   Short Term Goals: Ability to identify changes in lifestyle to reduce recurrence of condition will improve Ability to verbalize feelings will improve Ability to disclose and discuss suicidal ideas Ability to demonstrate self-control will improve     Medication Management: Evaluate patient's response, side effects, and tolerance of medication regimen.  Therapeutic Interventions: 1 to 1 sessions, Unit Group sessions and Medication  administration.  Evaluation of Outcomes: Progressing   RN Treatment Plan for Primary Diagnosis: Schizophrenia Long Term Goal(s): Knowledge of disease and therapeutic regimen to maintain health will improve  Short Term Goals: Ability to remain free from injury will improve, Ability to verbalize frustration and anger appropriately will improve, Ability to demonstrate self-control, Ability to participate in decision making will improve, Ability to verbalize feelings will  improve, Ability to disclose and discuss suicidal ideas, Ability to identify and develop effective coping behaviors will improve, and Compliance with prescribed medications will improve  Medication Management: RN will administer medications as ordered by provider, will assess and evaluate patient's response and provide education to patient for prescribed medication. RN will report any adverse and/or side effects to prescribing provider.  Therapeutic Interventions: 1 on 1 counseling sessions, Psychoeducation, Medication administration, Evaluate responses to treatment, Monitor vital signs and CBGs as ordered, Perform/monitor CIWA, COWS, AIMS and Fall Risk screenings as ordered, Perform wound care treatments as ordered.  Evaluation of Outcomes: Progressing   LCSW Treatment Plan for Primary Diagnosis: Schizophrenia Long Term Goal(s): Safe transition to appropriate next level of care at discharge, Engage patient in therapeutic group addressing interpersonal concerns.  Short Term Goals: Engage patient in aftercare planning with referrals and resources, Increase social support, Increase ability to appropriately verbalize feelings, Increase emotional regulation, Facilitate acceptance of mental health diagnosis and concerns, Facilitate patient progression through stages of change regarding substance use diagnoses and concerns, Identify triggers associated with mental health/substance abuse issues, and Increase skills for wellness and recovery  Therapeutic Interventions: Assess for all discharge needs, 1 to 1 time with Social worker, Explore available resources and support systems, Assess for adequacy in community support network, Educate family and significant other(s) on suicide prevention, Complete Psychosocial Assessment, Interpersonal group therapy.  Evaluation of Outcomes: Progressing   Progress in Treatment: Attending groups: Yes. Participating in groups: Yes. Taking medication as prescribed:  Yes. Toleration medication: Yes. Family/Significant other contact made: Yes, individual(s) contacted:  Family Member Patient understands diagnosis: Yes. Discussing patient identified problems/goals with staff: Yes. Medical problems stabilized or resolved: Yes. Denies suicidal/homicidal ideation: Yes. Issues/concerns per patient self-inventory: Yes. Other:   New problem(s) identified: No, Describe:  None Reported  New Short Term/Long Term Goal(s):medication stabilization, elimination of SI thoughts, development of comprehensive mental wellness plan.   Patient Goals:  Medication Stabilization  Discharge Plan or Barriers:  Discharge Plan or Barriers: Patient recently admitted. CSW will continue to follow and assess for appropriate referrals and possible discharge planning.   Reason for Continuation of Hospitalization: Delusions  Depression Hallucinations Medication stabilization  Estimated Length of Stay: 3-7 Days  Last [place] Suicide Severity Risk Score: [place] ED from 01/12/2023 in [facility] ED from 01/08/2020 in [facility] Emergency Department at [facility] ED from 10/27/2019 in [facility] Emergency Department at [place] No Risk No Risk No Risk       Last PHQ 2/9 Scores:     No data to display          medication stabilization, elimination of SI thoughts, development of comprehensive mental wellness plan.    Scribe for Treatment Team: Windle Guard, LCSW 01/23/2023 1:16 PM

## 2023-01-23 NOTE — Progress Notes (Signed)
[facility] MD Progress Note  01/23/2023 12:43 PM Megan Monroe  MRN:  [id number] Subjective:   In brief; Megan Monroe is a 27 year old female with history for schizophrenia, polysubstance use who presented to the [facility] under IVC with worsening psychosis and behavioral problems. Collateral information obtained from patient's grandmother patient has diagnosis of schizophrenia noncompliant with her medications made suicidal statement to her grandmother that she want to die.  Progress in the last 24 hrs: Staff reports that the patient has been fairly cooperative but remains mildly bizarre and fidgety.  She has occasional hallucinations per staff but she tends to deny this.  She is sleeping poorly and slept only 3-1/2 hours according to staff. She has been attending some groups.  She has received the first injection of Invega Sustenna 236 mg IM  On examination today: The patient continues to report that she is doing well.  She is alert and oriented x 3 and maintained fair to good eye contact.  Her speech is slow with some latency and hesitancy but in general she responds to questions appropriately.  She does have some bizarre hand movements and gestures when she talks.  She denied any hallucinations today and denied any delusions.  She is very vague about her living arrangement and gave the name and number of the person who is apparently in [place].  She does not specify what that person actually is responsible for.  When the social worker tried to contact that number she was told that the patient was a" member" but not sure what organization.  Today she denies any SI/HI/AVH.  She is awaiting her next dose of injection on Monday or Tuesday and will potentially be discharged right after that. Given her poor sleep and trazodone was increased to 100 mg at night.  Principal Problem: Schizophrenia Diagnosis: Principal Problem:   Schizophrenia ([place]) Active Problems:   Stimulant  use disorder   Cannabis use disorder, mild, abuse  Total Time spent with patient: 30 minutes  Past Psychiatric History:   Prior Psychiatric diagnoses: Schizophrenia Past Psychiatric Hospitalizations: [facility] 2021 and [facility] 2023   History of self mutilation: None noted Past suicide attempts: None noted, patient denies Past history of HI, violent or aggressive behavior: None noted   Past Psychiatric medications trials: Kirt Boys as noted above but noncompliant History of ECT/TMS: None noted   Outpatient psychiatric Follow up: Noncompliant Prior Outpatient Therapy: Unknown  Past Medical History:  Past Medical History:  Diagnosis Date   Exercise-induced asthma    No past surgical history on file. Family History:  Family History  Problem Relation Age of Onset   Hypertension Father    Diabetes Maternal Grandmother    Heart attack Cousin        Early 60s   Family Psychiatric  History: unknown Social History:  Social History   Substance and Sexual Activity  Alcohol Use No     Social History   Substance and Sexual Activity  Drug Use No    Social History   Socioeconomic History   Marital status: Single    Spouse name: Not on file   Number of children: Not on file   Years of education: Not on file   Highest education level: Not on file  Occupational History   Not on file  Tobacco Use   Smoking status: Never   Smokeless tobacco: Never  Vaping Use   Vaping Use: Never used  Substance and Sexual Activity   Alcohol use:  No   Drug use: No   Sexual activity: Never  Other Topics Concern   Not on file  Social History Narrative   Pt lives with mother, father (or step father, this information was not clarified to me) and grandmother.   Social Determinants of Health   Financial Resource Strain: Not on file  Food Insecurity: Not on file  Transportation Needs: Not on file  Physical Activity: Not on file  Stress: Not on file  Social Connections:  Not on file   Additional Social History:   Current Medications: Current Facility-Administered Medications  Medication Dose Route Frequency Provider Last Rate Last Admin   acetaminophen (TYLENOL) tablet 650 mg  650 mg Oral Q6H PRN Tharon Aquas, NP   650 mg at 01/14/23 0752   alum & mag hydroxide-simeth (MAALOX/MYLANTA) 200-200-20 MG/5ML suspension 30 mL  30 mL Oral Q4H PRN Tharon Aquas, NP       diphenhydrAMINE (BENADRYL) capsule 50 mg  50 mg Oral TID PRN Tharon Aquas, NP       Or   diphenhydrAMINE (BENADRYL) injection 50 mg  50 mg Intramuscular TID PRN Tharon Aquas, NP       feeding supplement (ENSURE ENLIVE / ENSURE PLUS) liquid 237 mL  237 mL Oral BID BM Massengill, Ovid Curd, MD   237 mL at 01/23/23 1122   haloperidol (HALDOL) tablet 5 mg  5 mg Oral TID PRN Tharon Aquas, NP       Or   haloperidol lactate (HALDOL) injection 5 mg  5 mg Intramuscular TID PRN Tharon Aquas, NP       hydrOXYzine (ATARAX) tablet 25 mg  25 mg Oral TID PRN Tharon Aquas, NP   25 mg at 01/21/23 0744   LORazepam (ATIVAN) tablet 2 mg  2 mg Oral TID PRN Tharon Aquas, NP       Or   LORazepam (ATIVAN) injection 2 mg  2 mg Intramuscular TID PRN Tharon Aquas, NP       magnesium hydroxide (MILK OF MAGNESIA) suspension 30 mL  30 mL Oral Daily PRN Tharon Aquas, NP       Derrill Memo ON 01/27/2023] paliperidone (INVEGA SUSTENNA) injection 156 mg  156 mg Intramuscular Q28 days Ranae Palms, MD       traZODone (DESYREL) tablet 50 mg  50 mg Oral QHS PRN Tharon Aquas, NP   50 mg at 01/18/23 2102    Lab Results:  No results found for this or any previous visit (from the past 46 hour(s)).   Blood Alcohol level:  Lab Results  Component Value Date   ETH <10 01/12/2023   ETH <10 99991111    Metabolic Disorder Labs: Lab Results  Component Value Date   HGBA1C 5.7 (H) 01/12/2023   MPG 117 01/12/2023   Lab Results  Component Value Date   PROLACTIN  5.5 01/12/2023   Lab Results  Component Value Date   CHOL 107 01/12/2023   TRIG 61 01/12/2023   HDL 30 (L) 01/12/2023   CHOLHDL 3.6 01/12/2023   VLDL 12 01/12/2023   LDLCALC 65 01/12/2023   LDLCALC 107 08/13/2012    Physical Findings: AIMS: Facial and Oral Movements Muscles of Facial Expression: None, normal Lips and Perioral Area: None, normal Jaw: None, normal Tongue: None, normal,Extremity Movements Upper (arms, wrists, hands, fingers): None, normal Lower (legs, knees, ankles, toes): None, normal, Trunk Movements Neck, shoulders, hips: None, normal, Overall Severity Severity of abnormal movements (highest score  from questions above): None, normal Incapacitation due to abnormal movements: None, normal Patient's awareness of abnormal movements (rate only patient's report): No Awareness, Dental Status Current problems with teeth and/or dentures?: No Does patient usually wear dentures?: No  CIWA:    COWS:     Musculoskeletal: Strength & Muscle Tone: within normal limits Gait & Station:  unable to assess as pt was lying in bed Patient leans: N/A  Psychiatric Specialty Exam:  Presentation  General Appearance: Appropriate for Environment; Bizarre Eye Contact: Poor Speech: minimal, mumbling Speech Volume: low Handedness: Right  Mood and Affect  Mood: "fine" Affect: Constricted  Thought Process  Thought Processes:Linear Descriptions of Associations: loose Orientation: unable to assess due to poor cooperation Thought Content: delusions History of Schizophrenia/Schizoaffective disorder:No Duration of Psychotic Symptoms:Greater than six months Hallucinations: responds to internal stimuli Ideas of Reference: unable to assess Suicidal Thoughts: patient denies current SI but reported having it yesterday Homicidal Thoughts: patient denies  Sensorium  Memory: unable to assess due to being uncooperative Judgment:Fair Insight: Poor  Executive Functions   Concentration: Fair Attention Span:Fair Recall:\Fair Fund of Knowledge:Fair Language:Fair  Psychomotor Activity  Psychomotor Activity: slowed  Assets  Assets: Desire for Improvement; Physical Health; Leisure Time   Sleep  Sleep: Fair   Physical Exam: Physical Exam Vitals and nursing note reviewed.  HENT:     Head: Normocephalic and atraumatic.  Eyes:     Conjunctiva/sclera: Conjunctivae normal.     Pupils: Pupils are equal, round, and reactive to light.  Pulmonary:     Effort: Pulmonary effort is normal.  Abdominal:     General: Abdomen is flat.  Skin:    General: Skin is warm.    Review of Systems  Constitutional: Negative.   HENT: Negative.    Eyes: Negative.   Respiratory: Negative.    Cardiovascular: Negative.   Gastrointestinal: Negative.   Neurological: Negative.   All other systems reviewed and are negative.  Blood pressure 123/79, pulse 96, temperature 98.7 F (37.1 C), temperature source Oral, resp. rate 15, height 5\' 3"  (1.6 m), weight 50.3 kg, SpO2 100 %. Body mass index is 19.66 kg/m.   Treatment Plan Summary: Daily contact with patient to assess and evaluate symptoms and progress in treatment and Medication management. The patient is doing slightly better than she did yesterday. Her Invega was increased to 6 mg yesterday and she tolerated well. NO EPS signs. She is still severely psychotic. Consider titration of Invega further. If patient displays behavioral issues and agitation, we'll consider adding Haldol or Zyprexa.    Principal Diagnosis: Schizophrenia ([place]) Diagnosis:  Principal Problem:   Schizophrenia ([place])     PLAN: Safety and Monitoring:             -- Involuntary admission to inpatient psychiatric unit for safety, stabilization and treatment             -- Daily contact with patient to assess and evaluate symptoms and progress in treatment             -- Patient's case to be discussed in multi-disciplinary team meeting              -- Observation Level : q15 minute checks             -- Vital signs:  q12 hours             -- Precautions: suicide, elopement, and assault   2. Medications:  Discontinue Invega 9 mg a day. Begin Kirt Boys  234 mg IM tomorrow.  Then she will receive 156 mg IM on April 2 and then every 4 weeks thereafter.                      As needed protocol for agitation and aggression             Atarax 25 mg 3 times daily as needed for anxiety             Increase trazodone 100 mg at bedtime as needed for sleep             Ceftriaxone 500 mg IM once for gonorrhea/STD, given 01/14/23               The risks/benefits/side-effects/alternatives to this medication were discussed in detail with the patient and time was given for questions. The patient consents to medication trial.                -- Metabolic profile and EKG monitoring obtained while on an atypical antipsychotic (BMI: Lipid Panel: HbgA1c: QTc:)                            3. Labs Reviewed: CMP no significant abnormalities noted, CBC no significant abnormalities noted, lipid profile no significant abnormalities noted, pregnancy test negative, hemoglobin A1c 5.7, TSH within normal limit, RPR reactive titer 1:2, but sent for confirmatory testing, gonorrhea positive, chlamydia negative, UDS positive to amphetamine, cocaine and marijuana, EKG 3/18 NSR QTc 439  HIV negative      Lab ordered: Confirmatory testing RPR   4. Group and Therapy: -- Encouraged patient to participate in unit milieu and in scheduled group therapies              --Substance Use counseling: As patient clears cognitively will counsel regarding need to abstain completely from illicit drug use after discharge   5. Discharge Planning:              -- Social work and case management to assist with discharge planning and identification of hospital follow-up needs prior to discharge             -- Estimated LOS: Possible discharge by next Tuesday.             -- Discharge  Concerns: Need to establish a safety plan; Medication compliance and effectiveness             -- Discharge Goals: Return home with outpatient referrals for mental health follow-up including medication management/psychotherapy     The patient is agreeable with the medication plan, as above. We will monitor the patient's response to pharmacologic treatment, and adjust medications as necessary. Patient is encouraged to participate in group therapy while admitted to the psychiatric unit. We will address other chronic and acute stressors, which contributed to the patient's increased psychosis, in order to reduce the risk of self-harm at discharge.     Physician Treatment Plan for Primary Diagnosis: Schizophrenia ([place]) Long Term Goal(s): Improvement in symptoms so as ready for discharge   Short Term Goals: Ability to identify changes in lifestyle to reduce recurrence of condition will improve, Ability to verbalize feelings will improve, Ability to disclose and discuss suicidal ideas, and Ability to demonstrate self-control will improve     I certify that inpatient services furnished can reasonably be expected to improve the patient's condition.      Ranae Palms, MD 01/23/2023, 12:43 PMPatient ID:  Megan Monroe, female   DOB: Oct 08, 1996, 27 y.o.   MRN: [id number] Patient ID: Megan Monroe, female   DOB: [birth year], 27 y.o.   MRN: [id number] Patient ID: Megan Monroe, female   DOB: 1995-12-14, 27 y.o.   MRN: [id number]

## 2023-01-23 NOTE — Progress Notes (Signed)
Adult Psychoeducational Group Note  Date:  01/23/2023 Time:  8:53 PM  Group Topic/Focus:  Wrap-Up Group:   The focus of this group is to help patients review their daily goal of treatment and discuss progress on daily workbooks.  Participation Level:  Minimal  Participation Quality:  Drowsy  Affect:  Flat and Not Congruent  Cognitive:  Confused  Insight: Lacking and Limited  Engagement in Group:  Lacking, Limited, and Off Topic  Modes of Intervention:  Discussion  Additional Comments:   Pt states that she had a good day but didn't offer writer any other information about the questions the Probation officer posed. Pt seemed confused and lethargic during group discussion  Gerhard Perches 01/23/2023, 8:53 PM

## 2023-01-23 NOTE — Progress Notes (Signed)
   01/23/23 0900  Psych Admission Type (Psych Patients Only)  Admission Status Involuntary  Psychosocial Assessment  Patient Complaints None  Eye Contact Brief  Facial Expression Animated  Affect Appropriate to circumstance  Speech Soft  Interaction Assertive  Motor Activity Fidgety;Restless  Appearance/Hygiene Unremarkable  Behavior Characteristics Cooperative  Mood Preoccupied;Pleasant  Aggressive Behavior  Effect No apparent injury  Thought Process  Coherency Concrete thinking  Content Preoccupation  Delusions None reported or observed  Perception WDL  Hallucination None reported or observed  Judgment Limited  Confusion Mild  Danger to Self  Current suicidal ideation? Denies  Danger to Others  Danger to Others None reported or observed

## 2023-01-23 NOTE — Progress Notes (Signed)
   01/23/23 2100  Psych Admission Type (Psych Patients Only)  Admission Status Involuntary  Psychosocial Assessment  Patient Complaints None  Eye Contact Brief  Facial Expression Animated  Affect Appropriate to circumstance  Speech Logical/coherent  Interaction Minimal  Motor Activity Fidgety;Restless  Appearance/Hygiene Improved  Behavior Characteristics Cooperative;Appropriate to situation  Mood Pleasant;Preoccupied  Aggressive Behavior  Effect No apparent injury  Thought Process  Coherency Concrete thinking  Content Preoccupation  Delusions None reported or observed  Perception WDL  Hallucination None reported or observed  Judgment Poor  Confusion Mild  Danger to Self  Current suicidal ideation? Denies

## 2023-01-23 NOTE — Group Note (Signed)
Recreation Therapy Group Note   Group Topic:Problem Solving  Group Date: 01/23/2023 Start Time: 1000 End Time: 1025 Facilitators: Zendaya Groseclose-McCall, LRT,CTRS Location: [address] Day[address]   Goal Area(s) Addresses:  Patient will effectively work with peer towards shared goal.  Patient will identify skill used to make activity successful.  Patient will identify how skills used during activity can be used to reach post d/c goals.   Group Description: Patient(s) were given a set of solo cups, a rubber band, and some tied strings. The objective is to build a pyramid with the cups by only using the rubber band and string to move the cups. After the activity the patient(s) are LRT debriefed and discussed what strategies worked, what didn't, and what lessons they can take from the activity and use in life post discharge.    Affect/Mood: N/A   Participation Level: Did not attend    Clinical Observations/Individualized Feedback:     Plan: Continue to engage patient in RT group sessions 2-3x/week.   Laurin Morgenstern-McCall, LRT,CTRS  01/23/2023 1:05 PM

## 2023-01-23 NOTE — [facility] Group Notes (Signed)
Spirituality group facilitated by Kathrynn Humble, [place].  Group Description: Group focused on topic of hope. Patients participated in facilitated discussion around topic, connecting with one another around experiences and definitions for hope. Group members engaged with visual explorer photos, reflecting on what hope looks like for them today. Group engaged in discussion around how their definitions of hope are present today in hospital.  Modalities: Psycho-social ed, Adlerian, Narrative, MI  Patient Progress: Megan Monroe was present during group.  Walked around the room for the entire session, but did participate in one of the group activities. At one time was observed laughing at something that didn't appear to relate to anything happening in the room.  [address], [place] Pager, [phone]

## 2023-01-23 NOTE — Progress Notes (Signed)
   01/23/23 [id number]  15 Minute Checks  Location Dayroom  Visual Appearance Calm  Behavior Composed  Sleep (Behavioral Health Patients Only)  Calculate sleep? (Click Yes once per 24 hr at 0600 safety check) Yes  Documented sleep last 24 hours 3.5

## 2023-01-23 NOTE — Group Note (Signed)
Date:  01/23/2023 Time:  9:48 AM  Group Topic/Focus:  Goals Group:   The focus of this group is to help patients establish daily goals to achieve during treatment and discuss how the patient can incorporate goal setting into their daily lives to aide in recovery. Orientation:   The focus of this group is to educate the patient on the purpose and policies of crisis stabilization and provide a format to answer questions about their admission.  The group details unit policies and expectations of patients while admitted.    Participation Level:  Active  Participation Quality:  Appropriate  Affect:  Appropriate  Cognitive:  Appropriate  Insight: Limited  Engagement in Group:  Improving  Modes of Intervention:  Discussion  Additional Comments:  Pt wants to go back to school to be a vet  Megan Monroe 01/23/2023, 9:48 AM

## 2023-01-24 DIAGNOSIS — F202 Catatonic schizophrenia: Secondary | ICD-10-CM | POA: Diagnosis not present

## 2023-01-24 NOTE — Progress Notes (Signed)
   01/24/23 2230  Psych Admission Type (Psych Patients Only)  Admission Status Involuntary  Psychosocial Assessment  Patient Complaints None  Eye Contact Brief  Facial Expression Animated  Affect Appropriate to circumstance  Speech Logical/coherent  Interaction Minimal  Motor Activity Fidgety;Restless  Appearance/Hygiene Improved  Behavior Characteristics Restless;Cooperative;Appropriate to situation  Mood Pleasant;Preoccupied  Aggressive Behavior  Effect No apparent injury  Thought Process  Coherency Concrete thinking  Content Preoccupation  Delusions None reported or observed  Perception WDL  Hallucination None reported or observed  Judgment Poor  Confusion Mild  Danger to Self  Current suicidal ideation? Denies

## 2023-01-24 NOTE — Group Note (Signed)
Date:  01/24/2023 Time:  9:36 PM  Group Topic/Focus:  Wrap-Up Group:   The focus of this group is to help patients review their daily goal of treatment and discuss progress on daily workbooks.    Participation Level:  Active  Participation Quality:  Appropriate  Affect:  Appropriate  Cognitive:  Appropriate  Insight: Appropriate  Engagement in Group:  Engaged  Modes of Intervention:  Education and Exploration  Additional Comments:  Patient attended and participated in group tonight. She reports that she have not learn anything today.  Megan Monroe [facility] 01/24/2023, 9:36 PM

## 2023-01-24 NOTE — Progress Notes (Signed)
[facility] MD Progress Note  01/24/2023 1:54 PM Megan Monroe  MRN:  [id number] Subjective:   In brief; Megan Monroe is a 27 year old female with history for schizophrenia, polysubstance use who presented to the [facility] under IVC with worsening psychosis and behavioral problems. Collateral information obtained from patient's grandmother patient has diagnosis of schizophrenia noncompliant with her medications made suicidal statement to her grandmother that she want to die.  Progress in the last 24 hrs: Staff report the patient is doing fairly well and has been compliant with medications and although she remains mildly bizarre in her presentation with hand gestures, she has not been a behavioral problem other than poor sleep.  She slept 7-1/4 last night and she is receiving trazodone 100 mg at night.  On examination today: The patient continues to endorse improved symptoms.  She maintained fair to good eye contact.  She has psychomotor retardation and has some affective blunting and her speech is of low volume with some latency but no obvious thought blocking.  She continues to remain vague about the living arrangement and claims that she has people of family.  Attempts were made to contact a number that she provided but the person would not give the organization that belonged to her claimed that she was a member.  Continued disposition is not yet in place.  Today she denies any SI/HI/AVH.  She has her next injection on Monday or Tuesday.  Will continue to monitor her sleep and symptoms.  Principal Problem: Schizophrenia Diagnosis: Principal Problem:   Schizophrenia (Megan Monroe) Active Problems:   Stimulant use disorder   Cannabis use disorder, mild, abuse  Total Time spent with patient: 30 minutes  Past Psychiatric History:   Prior Psychiatric diagnoses: Schizophrenia Past Psychiatric Hospitalizations: [facility] 2021 and [facility] 2023   History of self mutilation:  None noted Past suicide attempts: None noted, patient denies Past history of HI, violent or aggressive behavior: None noted   Past Psychiatric medications trials: Kirt Boys as noted above but noncompliant History of ECT/TMS: None noted   Outpatient psychiatric Follow up: Noncompliant Prior Outpatient Therapy: Unknown  Past Medical History:  Past Medical History:  Diagnosis Date   Exercise-induced asthma    No past surgical history on file. Family History:  Family History  Problem Relation Age of Onset   Hypertension Father    Diabetes Maternal Grandmother    Heart attack Cousin        Early 43s   Family Psychiatric  History: unknown Social History:  Social History   Substance and Sexual Activity  Alcohol Use No     Social History   Substance and Sexual Activity  Drug Use No    Social History   Socioeconomic History   Marital status: Single    Spouse name: Not on file   Number of children: Not on file   Years of education: Not on file   Highest education level: Not on file  Occupational History   Not on file  Tobacco Use   Smoking status: Never   Smokeless tobacco: Never  Vaping Use   Vaping Use: Never used  Substance and Sexual Activity   Alcohol use: No   Drug use: No   Sexual activity: Never  Other Topics Concern   Not on file  Social History Narrative   Pt lives with mother, father (or step father, this information was not clarified to me) and grandmother.   Social Determinants of Health   Financial Resource Strain:  Not on file  Food Insecurity: Not on file  Transportation Needs: Not on file  Physical Activity: Not on file  Stress: Not on file  Social Connections: Not on file   Additional Social History:   Current Medications: Current Facility-Administered Medications  Medication Dose Route Frequency Provider Last Rate Last Admin   acetaminophen (TYLENOL) tablet 650 mg  650 mg Oral Q6H PRN Tharon Aquas, NP   650 mg at 01/14/23  0752   alum & mag hydroxide-simeth (MAALOX/MYLANTA) 200-200-20 MG/5ML suspension 30 mL  30 mL Oral Q4H PRN Tharon Aquas, NP       diphenhydrAMINE (BENADRYL) capsule 50 mg  50 mg Oral TID PRN Tharon Aquas, NP       Or   diphenhydrAMINE (BENADRYL) injection 50 mg  50 mg Intramuscular TID PRN Tharon Aquas, NP       feeding supplement (ENSURE ENLIVE / ENSURE PLUS) liquid 237 mL  237 mL Oral BID BM Massengill, Ovid Curd, MD   237 mL at 01/24/23 0908   haloperidol (HALDOL) tablet 5 mg  5 mg Oral TID PRN Tharon Aquas, NP       Or   haloperidol lactate (HALDOL) injection 5 mg  5 mg Intramuscular TID PRN Tharon Aquas, NP       hydrOXYzine (ATARAX) tablet 25 mg  25 mg Oral TID PRN Tharon Aquas, NP   25 mg at 01/23/23 2044   LORazepam (ATIVAN) tablet 2 mg  2 mg Oral TID PRN Tharon Aquas, NP       Or   LORazepam (ATIVAN) injection 2 mg  2 mg Intramuscular TID PRN Tharon Aquas, NP       magnesium hydroxide (MILK OF MAGNESIA) suspension 30 mL  30 mL Oral Daily PRN Tharon Aquas, NP       Derrill Memo ON 01/27/2023] paliperidone (INVEGA SUSTENNA) injection 156 mg  156 mg Intramuscular Q28 days Ranae Palms, MD       traZODone (DESYREL) tablet 100 mg  100 mg Oral QHS PRN Ranae Palms, MD   100 mg at 01/23/23 2044    Lab Results:  No results found for this or any previous visit (from the past 81 hour(s)).   Blood Alcohol level:  Lab Results  Component Value Date   ETH <10 01/12/2023   ETH <10 99991111    Metabolic Disorder Labs: Lab Results  Component Value Date   HGBA1C 5.7 (H) 01/12/2023   MPG 117 01/12/2023   Lab Results  Component Value Date   PROLACTIN 5.5 01/12/2023   Lab Results  Component Value Date   CHOL 107 01/12/2023   TRIG 61 01/12/2023   HDL 30 (L) 01/12/2023   CHOLHDL 3.6 01/12/2023   VLDL 12 01/12/2023   LDLCALC 65 01/12/2023   LDLCALC 107 08/13/2012    Physical Findings: AIMS: Facial and Oral  Movements Muscles of Facial Expression: None, normal Lips and Perioral Area: None, normal Jaw: None, normal Tongue: None, normal,Extremity Movements Upper (arms, wrists, hands, fingers): None, normal Lower (legs, knees, ankles, toes): None, normal, Trunk Movements Neck, shoulders, hips: None, normal, Overall Severity Severity of abnormal movements (highest score from questions above): None, normal Incapacitation due to abnormal movements: None, normal Patient's awareness of abnormal movements (rate only patient's report): No Awareness, Dental Status Current problems with teeth and/or dentures?: No Does patient usually wear dentures?: No  CIWA:    COWS:     Musculoskeletal: Strength & Muscle Tone: within normal  limits Gait & Station:  unable to assess as pt was lying in bed Patient leans: N/A  Psychiatric Specialty Exam:  Presentation  General Appearance: Casual Eye Contact: Poor Speech: minimal, mumbling Speech Volume: low Handedness: Right  Mood and Affect  Mood: "fine" Affect: Blunt; Restricted  Thought Process  Thought Processes:Linear Descriptions of Associations: loose Orientation: unable to assess due to poor cooperation Thought Content: delusions History of Schizophrenia/Schizoaffective disorder:Yes Duration of Psychotic Symptoms:Greater than six months Hallucinations: responds to internal stimuli Ideas of Reference: unable to assess Suicidal Thoughts: patient denies current SI but reported having it yesterday Homicidal Thoughts: patient denies  Sensorium  Memory: unable to assess due to being uncooperative Judgment:Poor Insight: Poor  Executive Functions  Concentration: Fair Attention Span:Fair Recall:\Fair Fund of Knowledge:Fair Language:Fair  Psychomotor Activity  Psychomotor Activity: slowed  Assets  Assets: Desire for Improvement; Resilience; Leisure Time   Sleep  Sleep: Fair   Physical Exam: Physical Exam Vitals and nursing note  reviewed.  HENT:     Head: Normocephalic and atraumatic.  Eyes:     Conjunctiva/sclera: Conjunctivae normal.     Pupils: Pupils are equal, round, and reactive to light.  Pulmonary:     Effort: Pulmonary effort is normal.  Abdominal:     General: Abdomen is flat.  Skin:    General: Skin is Megan.    Review of Systems  Constitutional: Negative.   HENT: Negative.    Eyes: Negative.   Respiratory: Negative.    Cardiovascular: Negative.   Gastrointestinal: Negative.   Neurological: Negative.   All other systems reviewed and are negative.  Blood pressure 128/80, pulse (!) 107, temperature 99.2 F (37.3 C), temperature source Oral, resp. rate 15, height 5\' 3"  (1.6 m), weight 50.3 kg, SpO2 100 %. Body mass index is 19.66 kg/m.   Treatment Plan Summary: Daily contact with patient to assess and evaluate symptoms and progress in treatment and Medication management. The patient is doing slightly better than she did yesterday. Her Invega was increased to 6 mg yesterday and she tolerated well. NO EPS signs. She is still severely psychotic. Consider titration of Invega further. If patient displays behavioral issues and agitation, we'll consider adding Haldol or Zyprexa.    Principal Diagnosis: Schizophrenia ([place]) Diagnosis:  Principal Problem:   Schizophrenia ([place])     PLAN: Safety and Monitoring:             -- Involuntary admission to inpatient psychiatric unit for safety, stabilization and treatment             -- Daily contact with patient to assess and evaluate symptoms and progress in treatment             -- Patient's case to be discussed in multi-disciplinary team meeting             -- Observation Level : q15 minute checks             -- Vital signs:  q12 hours             -- Precautions: suicide, elopement, and assault   2. Medications:  Discontinue Invega 9 mg a day. Begin Invega Sustenna 234 mg IM tomorrow.  Then she will receive 156 mg IM on April 2 and then every 4  weeks thereafter.                      As needed protocol for agitation and aggression  Atarax 25 mg 3 times daily as needed for anxiety             Continue trazodone 100 mg at bedtime as needed for sleep             Ceftriaxone 500 mg IM once for gonorrhea/STD, given 01/14/23               The risks/benefits/side-effects/alternatives to this medication were discussed in detail with the patient and time was given for questions. The patient consents to medication trial.                -- Metabolic profile and EKG monitoring obtained while on an atypical antipsychotic (BMI: Lipid Panel: HbgA1c: QTc:)                            3. Labs Reviewed: CMP no significant abnormalities noted, CBC no significant abnormalities noted, lipid profile no significant abnormalities noted, pregnancy test negative, hemoglobin A1c 5.7, TSH within normal limit, RPR reactive titer 1:2, but sent for confirmatory testing, gonorrhea positive, chlamydia negative, UDS positive to amphetamine, cocaine and marijuana, EKG 3/18 NSR QTc 439  HIV negative      Lab ordered: Confirmatory testing RPR   4. Group and Therapy: -- Encouraged patient to participate in unit milieu and in scheduled group therapies              --Substance Use counseling: As patient clears cognitively will counsel regarding need to abstain completely from illicit drug use after discharge   5. Discharge Planning:              -- Social work and case management to assist with discharge planning and identification of hospital follow-up needs prior to discharge             -- Estimated LOS: Possible discharge by next Tuesday.             -- Discharge Concerns: Need to establish a safety plan; Medication compliance and effectiveness             -- Discharge Goals: Return home with outpatient referrals for mental health follow-up including medication management/psychotherapy     The patient is agreeable with the medication plan, as above. We  will monitor the patient's response to pharmacologic treatment, and adjust medications as necessary. Patient is encouraged to participate in group therapy while admitted to the psychiatric unit. We will address other chronic and acute stressors, which contributed to the patient's increased psychosis, in order to reduce the risk of self-harm at discharge.     Physician Treatment Plan for Primary Diagnosis: Schizophrenia ([place]) Long Term Goal(s): Improvement in symptoms so as ready for discharge   Short Term Goals: Ability to identify changes in lifestyle to reduce recurrence of condition will improve, Ability to verbalize feelings will improve, Ability to disclose and discuss suicidal ideas, and Ability to demonstrate self-control will improve     I certify that inpatient services furnished can reasonably be expected to improve the patient's condition.      Ranae Palms, MD 01/24/2023, 1:54 PMPatient ID: Oretha Milch, female   DOB: 22-Aug-1996, 27 y.o.   MRN: [id number] Patient ID: Deserea Eidson, female   DOB: 11-08-1995, 27 y.o.   MRN: [id number] Patient ID: Jamariyah Haddix, female   DOB: Mar 26, 1996, 27 y.o.   MRN: [id number] Patient ID: Daylen Sherk, female   DOB: [birth year], 27 y.o.   MRN: [id number]

## 2023-01-24 NOTE — Progress Notes (Signed)
   01/24/23 1000  Psych Admission Type (Psych Patients Only)  Admission Status Involuntary  Psychosocial Assessment  Patient Complaints None  Eye Contact Brief  Facial Expression Animated  Affect Appropriate to circumstance  Speech Soft  Interaction Minimal  Motor Activity Fidgety;Restless  Appearance/Hygiene Unremarkable  Behavior Characteristics Cooperative;Appropriate to situation  Mood Preoccupied;Pleasant  Aggressive Behavior  Effect No apparent injury  Thought Process  Coherency Concrete thinking  Content Preoccupation  Delusions None reported or observed  Perception WDL  Hallucination None reported or observed  Judgment Limited  Confusion Mild  Danger to Self  Current suicidal ideation? Denies  Danger to Others  Danger to Others None reported or observed

## 2023-01-24 NOTE — BHH Counselor (Signed)
B[facility]/BMU LCSW Progress Note   01/24/2023    2:26 PM  Megan Monroe      Type of Note: Safety Planning Attempt   CSW tried to reach to [place] someone who lives in [place] and had to leave a voicemail. When calling this number [phone], it rings once and goes to voicemail. CSW will continue to assist.     Signed:   Silas Flood, MSW, LCSWA 01/24/2023 2:26 PM

## 2023-01-24 NOTE — Progress Notes (Signed)
Adult Psychoeducational Group Note  Date:  01/24/2023 Time:  2:03 PM  Group Topic/Focus:  Goals Group:   The focus of this group is to help patients establish daily goals to achieve during treatment and discuss how the patient can incorporate goal setting into their daily lives to aide in recovery.  Participation Level:  Active  Participation Quality:  Appropriate  Affect:  Appropriate  Cognitive:  Appropriate  Insight: Appropriate  Engagement in Group:  Engaged  Modes of Intervention:  Discussion  Additional Comments:  Pt attended the goals group and remained appropriate and engaged throughout the duration of the group.   Beryle Beams 01/24/2023, 2:03 PM

## 2023-01-25 DIAGNOSIS — F202 Catatonic schizophrenia: Secondary | ICD-10-CM | POA: Diagnosis not present

## 2023-01-25 NOTE — Progress Notes (Signed)
   01/25/23 [id number]  15 Minute Checks  Location Bedroom  Visual Appearance Calm  Behavior Sleeping  Sleep (Behavioral Health Patients Only)  Calculate sleep? (Click Yes once per 24 hr at 0600 safety check) Yes  Documented sleep last 24 hours 6.75

## 2023-01-25 NOTE — Progress Notes (Signed)
   01/25/23 1100  Psych Admission Type (Psych Patients Only)  Admission Status Involuntary  Psychosocial Assessment  Patient Complaints None  Eye Contact Brief  Facial Expression Animated  Affect Appropriate to circumstance  Speech Soft;Slow  Interaction Minimal  Motor Activity Fidgety;Restless  Appearance/Hygiene Unremarkable  Behavior Characteristics Cooperative;Restless;Appropriate to situation  Mood Pleasant;Preoccupied  Aggressive Behavior  Effect No apparent injury  Thought Process  Coherency Concrete thinking  Content Preoccupation  Delusions None reported or observed  Perception Hallucinations  Hallucination Auditory  Judgment Poor  Confusion Mild  Danger to Self  Current suicidal ideation? Denies  Danger to Others  Danger to Others None reported or observed

## 2023-01-25 NOTE — Progress Notes (Signed)
[facility] MD Progress Note  01/25/2023 10:53 AM Shital Dewaard  MRN:  [id number] Subjective:   In brief; Megan Monroe is a 27 year old female with history for schizophrenia, polysubstance use who presented to the [facility] under IVC with worsening psychosis and behavioral problems. Collateral information obtained from patient's grandmother patient has diagnosis of schizophrenia noncompliant with her medications made suicidal statement to her grandmother that she want to die.  Progress in the last 24 hrs: Staff reports the patient has been compliant with medications and she slept 7.75 hours.  No behavioral problems are noted and she has taken the as needed trazodone at night.  No psychosis noted.  On examination today: The patient was seen and evaluated.  She is alert oriented and cooperative she has some unusual behaviors and mannerisms when she talks which includes using her hands and moving around the room.  Speech is of low volume with some latency but no obvious looseness of associations flight of ideas or tangentiality.  Today she tells me that she used to stay at a place called [facility] in [place] and she has a cousin and her children.  She is quite vague about the living arrangement and is unable to provide any concrete information.  She is otherwise contracting for safety.  No active SI/HI/AVH today.  She is due for her next injection early part of the week and discharge after that.   Principal Problem: Schizophrenia Diagnosis: Principal Problem:   Schizophrenia ([place]) Active Problems:   Stimulant use disorder   Cannabis use disorder, mild, abuse  Total Time spent with patient: 30 minutes  Past Psychiatric History:   Prior Psychiatric diagnoses: Schizophrenia Past Psychiatric Hospitalizations: [facility] 2021 and [facility] 2023   History of self mutilation: None noted Past suicide attempts: None noted, patient denies Past history of HI, violent  or aggressive behavior: None noted   Past Psychiatric medications trials: Kirt Boys as noted above but noncompliant History of ECT/TMS: None noted   Outpatient psychiatric Follow up: Noncompliant Prior Outpatient Therapy: Unknown  Past Medical History:  Past Medical History:  Diagnosis Date   Exercise-induced asthma    No past surgical history on file. Family History:  Family History  Problem Relation Age of Onset   Hypertension Father    Diabetes Maternal Grandmother    Heart attack Cousin        Early 40s   Family Psychiatric  History: unknown Social History:  Social History   Substance and Sexual Activity  Alcohol Use No     Social History   Substance and Sexual Activity  Drug Use No    Social History   Socioeconomic History   Marital status: Single    Spouse name: Not on file   Number of children: Not on file   Years of education: Not on file   Highest education level: Not on file  Occupational History   Not on file  Tobacco Use   Smoking status: Never   Smokeless tobacco: Never  Vaping Use   Vaping Use: Never used  Substance and Sexual Activity   Alcohol use: No   Drug use: No   Sexual activity: Never  Other Topics Concern   Not on file  Social History Narrative   Pt lives with mother, father (or step father, this information was not clarified to me) and grandmother.   Social Determinants of Health   Financial Resource Strain: Not on file  Food Insecurity: Not on file  Transportation  Needs: Not on file  Physical Activity: Not on file  Stress: Not on file  Social Connections: Not on file   Additional Social History:   Current Medications: Current Facility-Administered Medications  Medication Dose Route Frequency Provider Last Rate Last Admin   acetaminophen (TYLENOL) tablet 650 mg  650 mg Oral Q6H PRN Tharon Aquas, NP   650 mg at 01/14/23 0752   alum & mag hydroxide-simeth (MAALOX/MYLANTA) 200-200-20 MG/5ML suspension 30 mL  30  mL Oral Q4H PRN Tharon Aquas, NP       diphenhydrAMINE (BENADRYL) capsule 50 mg  50 mg Oral TID PRN Tharon Aquas, NP       Or   diphenhydrAMINE (BENADRYL) injection 50 mg  50 mg Intramuscular TID PRN Tharon Aquas, NP       feeding supplement (ENSURE ENLIVE / ENSURE PLUS) liquid 237 mL  237 mL Oral BID BM Massengill, Ovid Curd, MD   237 mL at 01/25/23 0919   haloperidol (HALDOL) tablet 5 mg  5 mg Oral TID PRN Tharon Aquas, NP       Or   haloperidol lactate (HALDOL) injection 5 mg  5 mg Intramuscular TID PRN Tharon Aquas, NP       hydrOXYzine (ATARAX) tablet 25 mg  25 mg Oral TID PRN Tharon Aquas, NP   25 mg at 01/24/23 2102   LORazepam (ATIVAN) tablet 2 mg  2 mg Oral TID PRN Tharon Aquas, NP       Or   LORazepam (ATIVAN) injection 2 mg  2 mg Intramuscular TID PRN Tharon Aquas, NP       magnesium hydroxide (MILK OF MAGNESIA) suspension 30 mL  30 mL Oral Daily PRN Tharon Aquas, NP       Derrill Memo ON 01/27/2023] paliperidone (INVEGA SUSTENNA) injection 156 mg  156 mg Intramuscular Q28 days Ranae Palms, MD       traZODone (DESYREL) tablet 100 mg  100 mg Oral QHS PRN Ranae Palms, MD   100 mg at 01/24/23 2102    Lab Results:  No results found for this or any previous visit (from the past 67 hour(s)).   Blood Alcohol level:  Lab Results  Component Value Date   ETH <10 01/12/2023   ETH <10 99991111    Metabolic Disorder Labs: Lab Results  Component Value Date   HGBA1C 5.7 (H) 01/12/2023   MPG 117 01/12/2023   Lab Results  Component Value Date   PROLACTIN 5.5 01/12/2023   Lab Results  Component Value Date   CHOL 107 01/12/2023   TRIG 61 01/12/2023   HDL 30 (L) 01/12/2023   CHOLHDL 3.6 01/12/2023   VLDL 12 01/12/2023   LDLCALC 65 01/12/2023   LDLCALC 107 08/13/2012    Physical Findings: AIMS: Facial and Oral Movements Muscles of Facial Expression: None, normal Lips and Perioral Area: None, normal Jaw: None,  normal Tongue: None, normal,Extremity Movements Upper (arms, wrists, hands, fingers): None, normal Lower (legs, knees, ankles, toes): None, normal, Trunk Movements Neck, shoulders, hips: None, normal, Overall Severity Severity of abnormal movements (highest score from questions above): None, normal Incapacitation due to abnormal movements: None, normal Patient's awareness of abnormal movements (rate only patient's report): No Awareness, Dental Status Current problems with teeth and/or dentures?: No Does patient usually wear dentures?: No  CIWA:    COWS:     Musculoskeletal: Strength & Muscle Tone: within normal limits Gait & Station:  unable to assess as pt was  lying in bed Patient leans: N/A  Psychiatric Specialty Exam:  Presentation  General Appearance: Bizarre Eye Contact: Poor Speech: minimal, mumbling Speech Volume: low Handedness: Right  Mood and Affect  Mood: "fine" Affect: Constricted  Thought Process  Thought Processes:Linear Descriptions of Associations: loose Orientation: unable to assess due to poor cooperation Thought Content: delusions History of Schizophrenia/Schizoaffective disorder:Yes Duration of Psychotic Symptoms:Greater than six months Hallucinations: responds to internal stimuli Ideas of Reference: unable to assess Suicidal Thoughts: patient denies current SI but reported having it yesterday Homicidal Thoughts: patient denies  Sensorium  Memory: unable to assess due to being uncooperative Judgment:Fair Insight: Poor  Executive Functions  Concentration: Fair Attention Span:Fair Recall:\Fair Fund of Knowledge:Fair Language:Fair  Psychomotor Activity  Psychomotor Activity: slowed  Assets  Assets: Desire for Improvement; Leisure Time   Sleep  Sleep: Fair   Physical Exam: Physical Exam Vitals and nursing note reviewed.  HENT:     Head: Normocephalic and atraumatic.  Eyes:     Conjunctiva/sclera: Conjunctivae normal.      Pupils: Pupils are equal, round, and reactive to light.  Pulmonary:     Effort: Pulmonary effort is normal.  Abdominal:     General: Abdomen is flat.  Skin:    General: Skin is warm.    Review of Systems  Constitutional: Negative.   HENT: Negative.    Eyes: Negative.   Respiratory: Negative.    Cardiovascular: Negative.   Gastrointestinal: Negative.   Neurological: Negative.   All other systems reviewed and are negative.  Blood pressure (!) 111/98, pulse (!) 101, temperature 98.6 F (37 C), temperature source Oral, resp. rate 15, height 5\' 3"  (1.6 m), weight 50.3 kg, SpO2 100 %. Body mass index is 19.66 kg/m.   Treatment Plan Summary: Daily contact with patient to assess and evaluate symptoms and progress in treatment and Medication management. The patient is doing slightly better than she did yesterday. Her Invega was increased to 6 mg yesterday and she tolerated well. NO EPS signs. She is still severely psychotic. Consider titration of Invega further. If patient displays behavioral issues and agitation, we'll consider adding Haldol or Zyprexa.    Principal Diagnosis: Schizophrenia ([place]) Diagnosis:  Principal Problem:   Schizophrenia ([place])     PLAN: Safety and Monitoring:             -- Involuntary admission to inpatient psychiatric unit for safety, stabilization and treatment             -- Daily contact with patient to assess and evaluate symptoms and progress in treatment             -- Patient's case to be discussed in multi-disciplinary team meeting             -- Observation Level : q15 minute checks             -- Vital signs:  q12 hours             -- Precautions: suicide, elopement, and assault   2. Medications:  Discontinue Invega 9 mg a day. Begin Invega Sustenna 234 mg IM tomorrow.  Then she will receive 156 mg IM on April 2 and then every 4 weeks thereafter.                      As needed protocol for agitation and aggression             Atarax 25 mg 3  times daily as needed  for anxiety             Continue trazodone 100 mg at bedtime as needed for sleep             Ceftriaxone 500 mg IM once for gonorrhea/STD, given 01/14/23               The risks/benefits/side-effects/alternatives to this medication were discussed in detail with the patient and time was given for questions. The patient consents to medication trial.                -- Metabolic profile and EKG monitoring obtained while on an atypical antipsychotic (BMI: Lipid Panel: HbgA1c: QTc:)                            3. Labs Reviewed: CMP no significant abnormalities noted, CBC no significant abnormalities noted, lipid profile no significant abnormalities noted, pregnancy test negative, hemoglobin A1c 5.7, TSH within normal limit, RPR reactive titer 1:2, but sent for confirmatory testing, gonorrhea positive, chlamydia negative, UDS positive to amphetamine, cocaine and marijuana, EKG 3/18 NSR QTc 439  HIV negative      Lab ordered: Confirmatory testing RPR   4. Group and Therapy: -- Encouraged patient to participate in unit milieu and in scheduled group therapies              --Substance Use counseling: As patient clears cognitively will counsel regarding need to abstain completely from illicit drug use after discharge   5. Discharge Planning:              -- Social work and case management to assist with discharge planning and identification of hospital follow-up needs prior to discharge             -- Estimated LOS: Possible discharge by next Tuesday or Wednesday after the second injection and received discharge plan is made.             -- Discharge Concerns: Need to establish a safety plan; Medication compliance and effectiveness             -- Discharge Goals: Return home with outpatient referrals for mental health follow-up including medication management/psychotherapy     The patient is agreeable with the medication plan, as above. We will monitor the patient's response to  pharmacologic treatment, and adjust medications as necessary. Patient is encouraged to participate in group therapy while admitted to the psychiatric unit. We will address other chronic and acute stressors, which contributed to the patient's increased psychosis, in order to reduce the risk of self-harm at discharge.     Physician Treatment Plan for Primary Diagnosis: Schizophrenia ([place]) Long Term Goal(s): Improvement in symptoms so as ready for discharge   Short Term Goals: Ability to identify changes in lifestyle to reduce recurrence of condition will improve, Ability to verbalize feelings will improve, Ability to disclose and discuss suicidal ideas, and Ability to demonstrate self-control will improve     I certify that inpatient services furnished can reasonably be expected to improve the patient's condition.      Ranae Palms, MD 01/25/2023, 10:53 AMPatient ID: Megan Monroe, female   DOB: 09/03/1996, 27 y.o.   MRN: [id number] Patient ID: Megan Monroe, female   DOB: 1996-09-13, 27 y.o.   MRN: [id number] Patient ID: Megan Monroe, female   DOB: December 17, 1995, 27 y.o.   MRN: [id number] Patient ID: Megan Monroe, female   DOB: 23-Jan-1996, 27 y.o.   MRN: [id number] Patient ID: Megan Fam  Monroe, female   DOB: [birth year], 27 y.o.   MRN: [id number]

## 2023-01-25 NOTE — [facility] Suicide Risk Assessment (Signed)
[facility] INPATIENT:  Family/Significant Other Suicide Prevention Education  Suicide Prevention Education:  Contact Attempts: Hinton Dyer (member- lives in [phone], (name of family member/significant other) has been identified by the patient as the family member/significant other with whom the patient will be residing, and identified as the person(s) who will aid the patient in the event of a mental health crisis.  With written consent from the patient, two attempts were made to provide suicide prevention education, prior to and/or following the patient's discharge.  We were unsuccessful in providing suicide prevention education.  A suicide education pamphlet was given to the patient to share with family/significant other.  Date and time of first attempt: 01/24/2023 / 2:26pm by Silas Flood, LCSWA Date and time of second attempt:  01/25/2023 11:02 AM by Selmer Dominion, LCSW  [place] [facility] Grossman-[facility]rr 01/25/2023, 11:02 AM

## 2023-01-25 NOTE — Progress Notes (Signed)
   01/25/23 2100  Psych Admission Type (Psych Patients Only)  Admission Status Involuntary  Psychosocial Assessment  Patient Complaints None  Eye Contact Brief  Facial Expression Animated  Affect Appropriate to circumstance  Speech Logical/coherent  Interaction Minimal  Motor Activity Fidgety;Restless  Appearance/Hygiene Improved  Aggressive Behavior  Effect No apparent injury  Thought Process  Coherency Concrete thinking  Content Preoccupation  Delusions None reported or observed  Perception Hallucinations  Hallucination Auditory  Judgment Poor  Confusion Mild  Danger to Self  Current suicidal ideation? Denies

## 2023-01-25 NOTE — Group Note (Signed)
Date:  01/25/2023 Time:  10:20 PM  Group Topic/Focus:  Wrap-Up Group:   The focus of this group is to help patients review their daily goal of treatment and discuss progress on daily workbooks.    Participation Level:  Did Not Attend  Debe Coder 01/25/2023, 10:20 PM

## 2023-01-26 DIAGNOSIS — F202 Catatonic schizophrenia: Secondary | ICD-10-CM | POA: Diagnosis not present

## 2023-01-26 NOTE — Group Note (Signed)
Date:  01/26/2023 Time:  9:01 PM  Group Topic/Focus:  Wrap-Up Group:   The focus of this group is to help patients review their daily goal of treatment and discuss progress on daily workbooks.    Participation Level:  Active  Participation Quality:  Appropriate  Affect:  Appropriate  Cognitive:  Appropriate  Insight: Appropriate  Engagement in Group:  Engaged  Modes of Intervention:  Education and Exploration  Additional Comments:  Patient attended and participated in group tonight. She reports that her day was good. She listen to music and eat food.  Sal[facility] 01/26/2023, 9:01 PM

## 2023-01-26 NOTE — Progress Notes (Signed)
   01/26/23 2015  Psych Admission Type (Psych Patients Only)  Admission Status Involuntary  Psychosocial Assessment  Patient Complaints None  Eye Contact Fair  Facial Expression Animated;Fixed smile  Affect Apprehensive;Appropriate to circumstance  Speech Logical/coherent  Interaction Minimal;Guarded  Motor Activity Fidgety;Restless  Appearance/Hygiene Improved  Behavior Characteristics Cooperative;Restless  Mood Suspicious;Preoccupied;Pleasant  Aggressive Behavior  Effect No apparent injury  Thought Process  Coherency Circumstantial  Content Preoccupation  Delusions None reported or observed  Perception Hallucinations  Hallucination Auditory  Judgment Impaired  Confusion Mild  Danger to Self  Current suicidal ideation? Denies

## 2023-01-26 NOTE — Progress Notes (Signed)
[facility] MD Progress Note  01/26/2023 11:26 AM Megan Monroe  MRN:  [id number] Subjective:   In brief; Megan Monroe is a 27 year old female with history for schizophrenia, polysubstance use who presented to the [facility] under IVC with worsening psychosis and behavioral problems. Collateral information obtained from patient's grandmother patient has diagnosis of schizophrenia noncompliant with her medications made suicidal statement to her grandmother that she want to die.  Progress in the last 24 hrs: Staff reports that the patient has slept 8 hours last night.  She has been attending groups sporadically.  She remains mildly animated and has some bizarre movements but in general apparently has been cooperative with medications.  She received trazodone and hydroxyzine as needed.  On examination today: The patient was seen and evaluated.  On examination there is no significant change since yesterday.  Patient remains passive but cooperative.  She denies psychosis and denies any active SI/HI/AVH.  She remains vague about where she lives.  There is some concern about her being used in sex trafficking.  She was positive for gonorrhea on admission which was treated in the ED.  Disposition is unclear because she has not been able to give [place] any permission regarding talking to her mother.  Once she has a safe discharge in place, she can be discharged.  She has her next dose of Invega Sustenna 156 mg on 01/27/2023.  Once she is stabilized, and has a safety plan she can be discharged most likely by Wednesday.     Principal Problem: Schizophrenia Diagnosis: Principal Problem:   Schizophrenia Active Problems:   Stimulant use disorder   Cannabis use disorder, mild, abuse  Total Time spent with patient: 30 minutes  Past Psychiatric History:   Prior Psychiatric diagnoses: Schizophrenia Past Psychiatric Hospitalizations: [facility] 2021 and [facility] 2023   History of self  mutilation: None noted Past suicide attempts: None noted, patient denies Past history of HI, violent or aggressive behavior: None noted   Past Psychiatric medications trials: Kirt Boys as noted above but noncompliant History of ECT/TMS: None noted   Outpatient psychiatric Follow up: Noncompliant Prior Outpatient Therapy: Unknown  Past Medical History:  Past Medical History:  Diagnosis Date   Exercise-induced asthma    No past surgical history on file. Family History:  Family History  Problem Relation Age of Onset   Hypertension Father    Diabetes Maternal Grandmother    Heart attack Cousin        Early 39s   Family Psychiatric  History: unknown Social History:  Social History   Substance and Sexual Activity  Alcohol Use No     Social History   Substance and Sexual Activity  Drug Use No    Social History   Socioeconomic History   Marital status: Single    Spouse name: Not on file   Number of children: Not on file   Years of education: Not on file   Highest education level: Not on file  Occupational History   Not on file  Tobacco Use   Smoking status: Never   Smokeless tobacco: Never  Vaping Use   Vaping Use: Never used  Substance and Sexual Activity   Alcohol use: No   Drug use: No   Sexual activity: Never  Other Topics Concern   Not on file  Social History Narrative   Pt lives with mother, father (or step father, this information was not clarified to me) and grandmother.   Social Determinants of Health  Financial Resource Strain: Not on file  Food Insecurity: Not on file  Transportation Needs: Not on file  Physical Activity: Not on file  Stress: Not on file  Social Connections: Not on file   Additional Social History:   Current Medications: Current Facility-Administered Medications  Medication Dose Route Frequency Provider Last Rate Last Admin   acetaminophen (TYLENOL) tablet 650 mg  650 mg Oral Q6H PRN Tharon Aquas, NP   650 mg  at 01/14/23 0752   alum & mag hydroxide-simeth (MAALOX/MYLANTA) 200-200-20 MG/5ML suspension 30 mL  30 mL Oral Q4H PRN Tharon Aquas, NP       diphenhydrAMINE (BENADRYL) capsule 50 mg  50 mg Oral TID PRN Tharon Aquas, NP       Or   diphenhydrAMINE (BENADRYL) injection 50 mg  50 mg Intramuscular TID PRN Tharon Aquas, NP       feeding supplement (ENSURE ENLIVE / ENSURE PLUS) liquid 237 mL  237 mL Oral BID BM Massengill, Ovid Curd, MD   237 mL at 01/26/23 1103   haloperidol (HALDOL) tablet 5 mg  5 mg Oral TID PRN Tharon Aquas, NP       Or   haloperidol lactate (HALDOL) injection 5 mg  5 mg Intramuscular TID PRN Tharon Aquas, NP       hydrOXYzine (ATARAX) tablet 25 mg  25 mg Oral TID PRN Tharon Aquas, NP   25 mg at 01/25/23 2049   LORazepam (ATIVAN) tablet 2 mg  2 mg Oral TID PRN Tharon Aquas, NP       Or   LORazepam (ATIVAN) injection 2 mg  2 mg Intramuscular TID PRN Tharon Aquas, NP       magnesium hydroxide (MILK OF MAGNESIA) suspension 30 mL  30 mL Oral Daily PRN Tharon Aquas, NP       Derrill Memo ON 01/27/2023] paliperidone (INVEGA SUSTENNA) injection 156 mg  156 mg Intramuscular Q28 days Ranae Palms, MD       traZODone (DESYREL) tablet 100 mg  100 mg Oral QHS PRN Ranae Palms, MD   100 mg at 01/25/23 2049    Lab Results:  No results found for this or any previous visit (from the past 4 hour(s)).   Blood Alcohol level:  Lab Results  Component Value Date   ETH <10 01/12/2023   ETH <10 99991111    Metabolic Disorder Labs: Lab Results  Component Value Date   HGBA1C 5.7 (H) 01/12/2023   MPG 117 01/12/2023   Lab Results  Component Value Date   PROLACTIN 5.5 01/12/2023   Lab Results  Component Value Date   CHOL 107 01/12/2023   TRIG 61 01/12/2023   HDL 30 (L) 01/12/2023   CHOLHDL 3.6 01/12/2023   VLDL 12 01/12/2023   LDLCALC 65 01/12/2023   LDLCALC 107 08/13/2012    Physical Findings: AIMS: Facial and Oral  Movements Muscles of Facial Expression: None, normal Lips and Perioral Area: None, normal Jaw: None, normal Tongue: None, normal,Extremity Movements Upper (arms, wrists, hands, fingers): None, normal Lower (legs, knees, ankles, toes): None, normal, Trunk Movements Neck, shoulders, hips: None, normal, Overall Severity Severity of abnormal movements (highest score from questions above): None, normal Incapacitation due to abnormal movements: None, normal Patient's awareness of abnormal movements (rate only patient's report): No Awareness, Dental Status Current problems with teeth and/or dentures?: No Does patient usually wear dentures?: No  CIWA:    COWS:     Musculoskeletal: Strength & Muscle  Tone: within normal limits Gait & Station:  unable to assess as pt was lying in bed Patient leans: N/A  Psychiatric Specialty Exam:  Presentation  General Appearance: Casual Eye Contact: Poor Speech: minimal, mumbling Speech Volume: low Handedness: Right  Mood and Affect  Mood: "fine" Affect: Constricted  Thought Process  Thought Processes:Disorganized Descriptions of Associations: loose Orientation: unable to assess due to poor cooperation Thought Content: delusions History of Schizophrenia/Schizoaffective disorder:Yes Duration of Psychotic Symptoms:Greater than six months Hallucinations: responds to internal stimuli Ideas of Reference: unable to assess Suicidal Thoughts: patient denies current SI but reported having it yesterday Homicidal Thoughts: patient denies  Sensorium  Memory: unable to assess due to being uncooperative Judgment:Fair Insight: Poor  Executive Functions  Concentration: Fair Attention Span:Fair Recall:\Fair Fund of Knowledge:Fair Language:Fair  Psychomotor Activity  Psychomotor Activity: slowed  Assets  Assets: Resilience; Desire for Improvement   Sleep  Sleep: Fair   Physical Exam: Physical Exam Vitals and nursing note reviewed.  HENT:      Head: Normocephalic and atraumatic.  Eyes:     Conjunctiva/sclera: Conjunctivae normal.     Pupils: Pupils are equal, round, and reactive to light.  Pulmonary:     Effort: Pulmonary effort is normal.  Abdominal:     General: Abdomen is flat.  Skin:    General: Skin is warm.    Review of Systems  Constitutional: Negative.   HENT: Negative.    Eyes: Negative.   Respiratory: Negative.    Cardiovascular: Negative.   Gastrointestinal: Negative.   Neurological: Negative.   All other systems reviewed and are negative.  Blood pressure 109/85, pulse (!) 107, temperature 98.8 F (37.1 C), temperature source Oral, resp. rate 15, height 5\' 3"  (1.6 m), weight 50.3 kg, SpO2 100 %. Body mass index is 19.66 kg/m.   Treatment Plan Summary: Daily contact with patient to assess and evaluate symptoms and progress in treatment and Medication management. The patient is doing slightly better than she did yesterday. Her Invega was increased to 6 mg yesterday and she tolerated well. NO EPS signs. She is still severely psychotic. Consider titration of Invega further. If patient displays behavioral issues and agitation, we'll consider adding Haldol or Zyprexa.    Principal Diagnosis: Schizophrenia ([place]) Diagnosis:  Principal Problem:   Schizophrenia ([place])     PLAN: Safety and Monitoring:             -- Involuntary admission to inpatient psychiatric unit for safety, stabilization and treatment             -- Daily contact with patient to assess and evaluate symptoms and progress in treatment             -- Patient's case to be discussed in multi-disciplinary team meeting             -- Observation Level : q15 minute checks             -- Vital signs:  q12 hours             -- Precautions: suicide, elopement, and assault   2. Medications:  Discontinue Invega 9 mg a day. Begin Invega Sustenna 234 mg IM tomorrow.  Then she will receive 156 mg IM on April 2 and then every 4 weeks thereafter.                       As needed protocol for agitation and aggression  Atarax 25 mg 3 times daily as needed for anxiety             Continue trazodone 100 mg at bedtime as needed for sleep             Ceftriaxone 500 mg IM once for gonorrhea/STD, given 01/14/23               The risks/benefits/side-effects/alternatives to this medication were discussed in detail with the patient and time was given for questions. The patient consents to medication trial.                -- Metabolic profile and EKG monitoring obtained while on an atypical antipsychotic (BMI: Lipid Panel: HbgA1c: QTc:)                            3. Labs Reviewed: CMP no significant abnormalities noted, CBC no significant abnormalities noted, lipid profile no significant abnormalities noted, pregnancy test negative, hemoglobin A1c 5.7, TSH within normal limit, RPR reactive titer 1:2, but sent for confirmatory testing, gonorrhea positive, chlamydia negative, UDS positive to amphetamine, cocaine and marijuana, EKG 3/18 NSR QTc 439  HIV negative      Lab ordered: Confirmatory testing RPR   4. Group and Therapy: -- Encouraged patient to participate in unit milieu and in scheduled group therapies              --Substance Use counseling: As patient clears cognitively will counsel regarding need to abstain completely from illicit drug use after discharge   5. Discharge Planning:              -- Social work and case management to assist with discharge planning and identification of hospital follow-up needs prior to discharge             -- Estimated LOS: Possible discharge by next Tuesday or Wednesday after the second injection and received discharge plan is made.             -- Discharge Concerns: Need to establish a safety plan; Medication compliance and effectiveness             -- Discharge Goals: Return home with outpatient referrals for mental health follow-up including medication management/psychotherapy     The patient is  agreeable with the medication plan, as above. We will monitor the patient's response to pharmacologic treatment, and adjust medications as necessary. Patient is encouraged to participate in group therapy while admitted to the psychiatric unit. We will address other chronic and acute stressors, which contributed to the patient's increased psychosis, in order to reduce the risk of self-harm at discharge.     Physician Treatment Plan for Primary Diagnosis: Schizophrenia ([place]) Long Term Goal(s): Improvement in symptoms so as ready for discharge   Short Term Goals: Ability to identify changes in lifestyle to reduce recurrence of condition will improve, Ability to verbalize feelings will improve, Ability to disclose and discuss suicidal ideas, and Ability to demonstrate self-control will improve     I certify that inpatient services furnished can reasonably be expected to improve the patient's condition.      Ranae Palms, MD 01/26/2023, 11:26 AMPatient ID: Oretha Milch, female   DOB: 1996/04/19, 27 y.o.   MRN: [id number] Patient ID: Cyriah Weinand, female   DOB: 1996/02/27, 27 y.o.   MRN: [id number] Patient ID: Elexia London, female   DOB: August 20, 1996, 27 y.o.   MRN: [id number] Patient ID: Tejal Clendenon, female   DOB: 1996-01-26,  27 y.o.   MRN: [id number] Patient ID: Omunique Ostrand, female   DOB: 01-30-1996, 27 y.o.   MRN: [id number] Patient ID: Betsie Heimberger, female   DOB: 1996-01-09, 27 y.o.   MRN: [id number]

## 2023-01-26 NOTE — Group Note (Signed)
Date:  01/26/2023 Time:  1:55 PM  Group Topic/Focus:  Wellness Toolbox:   The focus of this group is to discuss various aspects of wellness, balancing those aspects and exploring ways to increase the ability to experience wellness.  Patients will create a wellness toolbox for use upon discharge.    Participation Level:  Active  Participation Quality:  Appropriate  Affect:  Appropriate  Cognitive:  Appropriate  Insight: Appropriate  Engagement in Group:  Engaged  Modes of Intervention:  Discussion  Additional Comments:    Garvin Fila 01/26/2023, 1:55 PM

## 2023-01-26 NOTE — Group Note (Signed)
Recreation Therapy Group Note   Group Topic:Coping Skills  Group Date: 01/26/2023 Start Time: 1000 End Time: 1050 Facilitators: Lenice Koper-McCall, LRT,CTRS Location: [address] Dayroom   Goal Area(s) Addresses:  Patient will verbalize benefit of exercise during group session. Patient will identify an exercise that can be completed post d/c. Patient will acknowledge benefits of exercise when used as a coping mechanism.   Group Description:  Exercise.  LRT introduced the activity of exercise patients.  LRT explained to patients they would be leading the activity by choosing the exercises that were completed by the group.  LRT and group completed three rounds of exercises.  Patients were instructed to take breaks if needed and listen to their bodies so as to not do any exercises that are outside of their ability.   Affect/Mood: Labile   Participation Level: None   Participation Quality: Independent   Behavior: Nonchalant   Speech/Thought Process: Irrational   Insight: Lacking   Judgement: Lacking    Modes of Intervention: Worksheet   Patient Response to Interventions:  Disengaged   Education Outcome:  In group clarification offered    Clinical Observations/Individualized Feedback: Pt was supposed to identify coping skills for depression.  Pt didn't come up with any coping skills.  Pt appeared to stare off and be in her own world.  Pt was quiet but would engaged when prompted.  Pt also had random laughing throughout group.   Plan: Continue to engage patient in RT group sessions 2-3x/week.   Fernanda Twaddell-McCall, LRT,CTRS 01/26/2023 2:03 PM

## 2023-01-26 NOTE — [facility] Counselor (Signed)
[facility] LCSW Progress Note   01/26/2023    3:17 PM  Megan Monroe   [id number]   Type of Contact and Topic:  Collateral and safe discharge planning  CSW spoke with patient grandmother with patient permission.  Grandmother worried that patient will not follow up with appropriate resources when discharged.  CSW explained that patient is her own guardian and provided education on how to get guardianship if family feels that she does not make decisions within her best interest.  Grandmother states that patient can not stay with them at this time and that she will be "running the streets again."    CSW explained that patient is currently IVCed but we don't have anything to keep her at this time.  Grandmother states that patient may be able to stay with a friend that is coming in town tomorrow. She may not be able to be in town to the afternoon but could pick up on Wednesday morning.  CSW states that they would inform physician and that if friend can not support the patient will be discharged Wednesday morning to a shelter.  Grandmother was in agreement with this plan.  CSW provided grandmother with follow up appointments.   CSW spoke with patient and discussed IVC process.  Patient agreed that she will stay here til Wednesday and was informed about plan B of going to a shelter.  Patient states that if she does not discharge tomorrow she is willing to sign in voluntarily.  CSW informed provider about safe discharge plans.     Signed:  Riki Altes MSW, LCSW, LCAS 01/26/2023 3:17 PM

## 2023-01-26 NOTE — Progress Notes (Signed)
   01/26/23 0557  15 Minute Checks  Location Bedroom  Visual Appearance Calm  Behavior Sleeping  Sleep (Behavioral Health Patients Only)  Calculate sleep? (Click Yes once per 24 hr at 0600 safety check) Yes  Documented sleep last 24 hours 8

## 2023-01-27 DIAGNOSIS — F203 Undifferentiated schizophrenia: Secondary | ICD-10-CM | POA: Diagnosis not present

## 2023-01-27 NOTE — Progress Notes (Signed)
[facility] MD Progress Note  01/27/2023 12:22 PM Megan Monroe  MRN:  [id number] Subjective:   In brief; Megan Monroe is a 27 year old female with history for schizophrenia, polysubstance use who presented to the [facility] under IVC with worsening psychosis and behavioral problems. Collateral information obtained from patient's grandmother patient has diagnosis of schizophrenia noncompliant with her medications made suicidal statement to her grandmother that she want to die.  Progress in the last 24 hrs: Staff reports that the patient has slept 8 hours last night.  She has been attending groups sporadically.  She remains mildly animated and has some bizarre movements but in general apparently has been cooperative with medications.  She received trazodone and hydroxyzine as needed with good efficacy reported.  Staff report patient occasionally disorganized but no episode of responding to stimuli or talking to herself or laughing inappropriately to be reported.  On examination today: The patient was seen and evaluated.  Upon evaluation today patient is able to answer orientation questions with limited orientation noted telling me today is January 26, 2023 and instead of April 2 she is unable to tell me the name of the day but oriented to place as behavioral health Hospital.  She continues to be vague about why she is here "I do not know" she denies SI HI or AVH when asked and does not present responding to stimuli or laughing inappropriately which was the case until earlier during this hospitalization.  She denies paranoia or other delusions.  She denies feeling depressed or anxious or wishing self dead or feeling hopeless or helpless.  I discussed with patient that a family friend will be coming to town later today with a plan to pick her up tomorrow to stabilize her anxiety will facilitate compliance after discharge and decrease the risk of decompensation, she agrees with this plan and  signed herself in voluntarily until able to execute this plan tomorrow for discharge.  She does not report any side effect to medications and does not display any sign consistent with TD or EPS and does not appear sleepy or sedated, she agrees to comply with medications in the hospital and after discharge.  She is receiving her second systemic injection today and agrees to receive it monthly after discharge.    Principal Problem: Schizophrenia Diagnosis: Principal Problem:   Schizophrenia Active Problems:   Stimulant use disorder   Cannabis use disorder, mild, abuse  Total Time spent with patient: 30 minutes  Past Psychiatric History:   Prior Psychiatric diagnoses: Schizophrenia Past Psychiatric Hospitalizations: [facility] 2021 and [facility] 2023   History of self mutilation: None noted Past suicide attempts: None noted, patient denies Past history of HI, violent or aggressive behavior: None noted   Past Psychiatric medications trials: Kirt Boys as noted above but noncompliant History of ECT/TMS: None noted   Outpatient psychiatric Follow up: Noncompliant Prior Outpatient Therapy: Unknown  Past Medical History:  Past Medical History:  Diagnosis Date   Exercise-induced asthma    No past surgical history on file. Family History:  Family History  Problem Relation Age of Onset   Hypertension Father    Diabetes Maternal Grandmother    Heart attack Cousin        Early 20s   Family Psychiatric  History: unknown Social History:  Social History   Substance and Sexual Activity  Alcohol Use No     Social History   Substance and Sexual Activity  Drug Use No    Social History  Socioeconomic History   Marital status: Single    Spouse name: Not on file   Number of children: Not on file   Years of education: Not on file   Highest education level: Not on file  Occupational History   Not on file  Tobacco Use   Smoking status: Never   Smokeless tobacco:  Never  Vaping Use   Vaping Use: Never used  Substance and Sexual Activity   Alcohol use: No   Drug use: No   Sexual activity: Never  Other Topics Concern   Not on file  Social History Narrative   Pt lives with mother, father (or step father, this information was not clarified to me) and grandmother.   Social Determinants of Health   Financial Resource Strain: Not on file  Food Insecurity: Not on file  Transportation Needs: Not on file  Physical Activity: Not on file  Stress: Not on file  Social Connections: Not on file   Additional Social History:   Current Medications: Current Facility-Administered Medications  Medication Dose Route Frequency Provider Last Rate Last Admin   acetaminophen (TYLENOL) tablet 650 mg  650 mg Oral Q6H PRN Tharon Aquas, NP   650 mg at 01/14/23 0752   alum & mag hydroxide-simeth (MAALOX/MYLANTA) 200-200-20 MG/5ML suspension 30 mL  30 mL Oral Q4H PRN Tharon Aquas, NP       diphenhydrAMINE (BENADRYL) capsule 50 mg  50 mg Oral TID PRN Tharon Aquas, NP       Or   diphenhydrAMINE (BENADRYL) injection 50 mg  50 mg Intramuscular TID PRN Tharon Aquas, NP       feeding supplement (ENSURE ENLIVE / ENSURE PLUS) liquid 237 mL  237 mL Oral BID BM Massengill, Ovid Curd, MD   237 mL at 01/26/23 1400   haloperidol (HALDOL) tablet 5 mg  5 mg Oral TID PRN Tharon Aquas, NP       Or   haloperidol lactate (HALDOL) injection 5 mg  5 mg Intramuscular TID PRN Tharon Aquas, NP       hydrOXYzine (ATARAX) tablet 25 mg  25 mg Oral TID PRN Tharon Aquas, NP   25 mg at 01/26/23 2051   LORazepam (ATIVAN) tablet 2 mg  2 mg Oral TID PRN Tharon Aquas, NP       Or   LORazepam (ATIVAN) injection 2 mg  2 mg Intramuscular TID PRN Tharon Aquas, NP       magnesium hydroxide (MILK OF MAGNESIA) suspension 30 mL  30 mL Oral Daily PRN Tharon Aquas, NP       paliperidone (INVEGA SUSTENNA) injection 156 mg  156 mg Intramuscular Q28  days Ranae Palms, MD       traZODone (DESYREL) tablet 100 mg  100 mg Oral QHS PRN Ranae Palms, MD   100 mg at 01/26/23 2051    Lab Results:  No results found for this or any previous visit (from the past 56 hour(s)).   Blood Alcohol level:  Lab Results  Component Value Date   [facility] <10 01/12/2023   ETH <10 99991111    Metabolic Disorder Labs: Lab Results  Component Value Date   HGBA1C 5.7 (H) 01/12/2023   MPG 117 01/12/2023   Lab Results  Component Value Date   PROLACTIN 5.5 01/12/2023   Lab Results  Component Value Date   CHOL 107 01/12/2023   TRIG 61 01/12/2023   HDL 30 (L) 01/12/2023   CHOLHDL 3.6  01/12/2023   VLDL 12 01/12/2023   LDLCALC 65 01/12/2023   LDLCALC 107 08/13/2012    Physical Findings: AIMS: Facial and Oral Movements Muscles of Facial Expression: None, normal Lips and Perioral Area: None, normal Jaw: None, normal Tongue: None, normal,Extremity Movements Upper (arms, wrists, hands, fingers): None, normal Lower (legs, knees, ankles, toes): None, normal, Trunk Movements Neck, shoulders, hips: None, normal, Overall Severity Severity of abnormal movements (highest score from questions above): None, normal Incapacitation due to abnormal movements: None, normal Patient's awareness of abnormal movements (rate only patient's report): No Awareness, Dental Status Current problems with teeth and/or dentures?: No Does patient usually wear dentures?: No  CIWA:    COWS:     Musculoskeletal: Strength & Muscle Tone: within normal limits Gait & Station:  unable to assess as pt was lying in bed Patient leans: N/A  Psychiatric Specialty Exam:  Presentation  General Appearance: Casual Eye Contact: Poor Speech: Decreased amount Speech Volume: low Handedness: Right  Mood and Affect  Mood: "fine" Affect: Constricted  Thought Process  Thought Processes: Disorganized at times but linear answering questions in a concrete manner and occasionally  vague.   descriptions of Associations: Concrete Orientation: Notes date is January 26, 2023 instead of January 27, 2023, unable to tell me the name of the day, oriented to place as behavioral health Hospital Thought Content: Disorganized but no paranoia or other delusions noted History of Schizophrenia/Schizoaffective disorder:Yes Duration of Psychotic Symptoms:Greater than six months Hallucinations: Does not appear responding to internal stimuli during evaluation or laughing to herself loudly like previously Ideas of Reference: unable to assess Suicidal Thoughts: patient denies current SI intention or plan Homicidal Thoughts: patient denies  Sensorium  Memory: Limited Judgment:Compliant with treatment and agrees to comply after dischargeFair Insight: Limited  Executive Functions  Concentration: Fair Attention Span:Fair Recall:\[place]  Psychomotor Activity  Psychomotor Activity: slowed  Assets  Assets: Resilience; Desire for Improvement   Sleep  Sleep: Fair   Physical Exam: Physical Exam Vitals and nursing note reviewed.  HENT:     Head: Normocephalic and atraumatic.  Eyes:     Conjunctiva/sclera: Conjunctivae normal.     Pupils: Pupils are equal, round, and reactive to light.  Pulmonary:     Effort: Pulmonary effort is normal.  Abdominal:     General: Abdomen is flat.  Skin:    General: Skin is warm.    Review of Systems  Constitutional: Negative.   HENT: Negative.    Eyes: Negative.   Respiratory: Negative.    Cardiovascular: Negative.   Gastrointestinal: Negative.   Neurological: Negative.   All other systems reviewed and are negative.  Blood pressure 132/80, pulse 98, temperature 98.7 F (37.1 C), temperature source Oral, resp. rate 15, height 5\' 3"  (1.6 m), weight 50.3 kg, SpO2 100 %. Body mass index is 19.66 kg/m.   Treatment Plan Summary: Daily contact with patient to assess and evaluate symptoms and progress in  treatment and Medication management. The patient is doing slightly better than she did yesterday. Her Invega was increased to 6 mg yesterday and she tolerated well. NO EPS signs. She is still severely psychotic. Consider titration of Invega further. If patient displays behavioral issues and agitation, we'll consider adding Haldol or Zyprexa.    Principal Diagnosis: Schizophrenia ([place]) Diagnosis:  Principal Problem:   Schizophrenia ([place])     PLAN: Safety and Monitoring:             -- Involuntary admission to inpatient psychiatric  unit for safety, stabilization and treatment             -- Daily contact with patient to assess and evaluate symptoms and progress in treatment             -- Patient's case to be discussed in multi-disciplinary team meeting             -- Observation Level : q15 minute checks             -- Vital signs:  q12 hours             -- Precautions: suicide, elopement, and assault   2. Medications:   Continue Kirt Boys, patient received Kirt Boys 234 mg IM few days ago, receiving 156 mg IM today then every 28 days.                   As needed protocol for agitation and aggression             Atarax 25 mg 3 times daily as needed for anxiety             Continue trazodone 100 mg at bedtime as needed for sleep             Ceftriaxone 500 mg IM once for gonorrhea/STD, given 01/14/23               The risks/benefits/side-effects/alternatives to this medication were discussed in detail with the patient and time was given for questions. The patient consents to medication trial.                -- Metabolic profile and EKG monitoring obtained while on an atypical antipsychotic (BMI: Lipid Panel: HbgA1c: QTc:)                            3. Labs Reviewed: CMP no significant abnormalities noted, CBC no significant abnormalities noted, lipid profile no significant abnormalities noted, pregnancy test negative, hemoglobin A1c 5.7, TSH within normal limit, RPR reactive  titer 1:2, but sent for confirmatory testing, gonorrhea positive, chlamydia negative, UDS positive to amphetamine, cocaine and marijuana, EKG 3/18 NSR QTc 439  HIV negative      Lab ordered: Confirmatory testing RPR   4. Group and Therapy: -- Encouraged patient to participate in unit milieu and in scheduled group therapies              --Substance Use counseling: As patient clears cognitively will counsel regarding need to abstain completely from illicit drug use after discharge   5. Discharge Planning:              -- Social work and case management to assist with discharge planning and identification of hospital follow-up needs prior to discharge             -- Estimated LOS: Discharge tomorrow 4/3 after the second injection of [place] on 4/2 and received discharge plan is made.             -- Discharge Concerns: Need to establish a safety plan; Medication compliance and effectiveness             -- Discharge Goals: Return home with outpatient referrals for mental health follow-up including medication management/psychotherapy     The patient is agreeable with the medication plan, as above. We will monitor the patient's response to pharmacologic treatment, and adjust medications as necessary. Patient is encouraged to participate  in group therapy while admitted to the psychiatric unit. We will address other chronic and acute stressors, which contributed to the patient's increased psychosis, in order to reduce the risk of self-harm at discharge.     Physician Treatment Plan for Primary Diagnosis: Schizophrenia ([place]) Long Term Goal(s): Improvement in symptoms so as ready for discharge   Short Term Goals: Ability to identify changes in lifestyle to reduce recurrence of condition will improve, Ability to verbalize feelings will improve, Ability to disclose and discuss suicidal ideas, and Ability to demonstrate self-control will improve     I certify that inpatient services furnished  can reasonably be expected to improve the patient's condition.      Dian Situ, MD 01/27/2023, 12:22 PMPatient ID: Oretha Milch, female   DOB: 07-23-1996, 27 y.o.   MRN: [id number] Patient ID: Arenda Daube, female   DOB: Mar 09, 1996, 27 y.o.   MRN: [id number] Patient ID: Trenee Matte, female   DOB: [birth year], 27 y.o.   MRN: [id number] Patient ID: Lillar Shutter, female   DOB: 1996/09/28, 27 y.o.   MRN: [id number] Patient ID: Jakelin Mudry, female   DOB: 30-Apr-1996, 27 y.o.   MRN: [id number] Patient ID: Nikeia Flansburg, female   DOB: 1996-10-23, 27 y.o.   MRN: [id number]

## 2023-01-27 NOTE — Group Note (Signed)
Date:  01/27/2023 Time:  4:45 PM  Group Topic/Focus:  Wellness Toolbox:   The focus of this group is to discuss various aspects of wellness, balancing those aspects and exploring ways to increase the ability to experience wellness.  Patients will create a wellness toolbox for use upon discharge.    Participation Level:  Active  Participation Quality:  Appropriate  Affect:    Cognitive:    Insight:   Engagement in Group:    Modes of Intervention:    Additional Comments:    Garvin Fila 01/27/2023, 4:45 PM

## 2023-01-27 NOTE — Group Note (Signed)
Recreation Therapy Group Note   Group Topic:Self-Esteem  Group Date: 01/27/2023 Start Time: [id number] End Time: 1116 Facilitators: Izmael Duross-McCall, LRT,CTRS Location: [address] Dayroom   Goal Area(s) Addresses:   Patient will successfully identify things that describe them and are important to them.  Patient will successfully express the importance of self esteem.   Group Description: Patient asked to create a personalized collage on their construction paper color of choice. Patients were told to make the collage relative to things that represent them, things they like and things that are important to them. Patients were given multiple craft materials to complete this activity. Patients shared their collages when they completed.   Affect/Mood: Appropriate   Participation Level: Minimal   Participation Quality: Minimal Cues   Behavior: Nonchalant   Speech/Thought Process: Distracted   Insight: Lacking   Judgement: Lacking    Modes of Intervention: Art   Patient Response to Interventions:  Receptive   Education Outcome:  Acknowledges education and In group clarification offered    Clinical Observations/Individualized Feedback: Pt would still zone out into her own thoughts.  Pt was also randomly laughing at points.  Pt had to be prompted several times to attempt to complete the activity, which she finally did.  On her collage, pt was focused on her like of make-up and getting dolled up.  Pt also verbally stated she likes music, baked spaghetti and peach cobbler.     Plan: Continue to engage patient in RT group sessions 2-3x/week.   Analeya Luallen-McCall, LRT,CTRS  01/27/2023 1:32 PM

## 2023-01-27 NOTE — Progress Notes (Signed)
[facility] LCSW Progress Note   01/27/2023    3:39 PM  Oretha Milch   [id number]   Type of Contact and Topic:  Safety Planning and Discharge.   Family friend and patient cousin, Carloyn Jaeger, will be picking patient up at 11am.  Provided patient support system with follow up information and medication information.  Support provided opportunity to ask questions.      Signed:  Riki Altes MSW, LCSW, LCAS 01/27/2023 3:39 PM

## 2023-01-27 NOTE — Progress Notes (Signed)
Observe pacing in milieu at intervals. Mood has been labile with periods irritability, agitation and fixed smiles. Brought back to unit early from dinner after she threw her cornbread at staff. Went straight into the shower on arrival to unit; came out shadow boxing. Verbally redirected. PRN vistaril 25 mg PO given at 1821 with desired effect when reassessed. Pt denies SI, HI,AVH and pain when assessed "no" however, pt is preoccupied and responding to internal stimuli. Safety checks maintained on and off milieu at Q 15 minutes intervals. Pt is medication compliant, denies adverse drug reactions. Support, encouragement and reassurance offered. She attended and participated appropriately in scheduled groups. Tolerates meals and fluids well. Safety maintained.

## 2023-01-27 NOTE — Group Note (Signed)
Date:  01/27/2023 Time:  11:42 AM  Group Topic/Focus:  Goals Group:   The focus of this group is to help patients establish daily goals to achieve during treatment and discuss how the patient can incorporate goal setting into their daily lives to aide in recovery. Orientation:   The focus of this group is to educate the patient on the purpose and policies of crisis stabilization and provide a format to answer questions about their admission.  The group details unit policies and expectations of patients while admitted.    Participation Level:  Active  Participation Quality:  Appropriate  Affect:  Appropriate  Cognitive:  Appropriate  Insight: Appropriate  Engagement in Group:  Engaged  Modes of Intervention:  Discussion  Additional Comments:  Patient attended morning orientation/goal group and said that her goal for today is to be clam.  Lerae Langham W Anias Bartol 0000000, 11:42 AM

## 2023-01-27 NOTE — Group Note (Signed)
Date:  01/27/2023 Time:  9:36 PM  Group Topic/Focus:  Wrap-Up Group:   The focus of this group is to help patients review their daily goal of treatment and discuss progress on daily workbooks.    Participation Level:  Did Not Attend     Debe Coder 01/27/2023, 9:36 PM

## 2023-01-28 DIAGNOSIS — F203 Undifferentiated schizophrenia: Secondary | ICD-10-CM | POA: Diagnosis not present

## 2023-01-28 MED ORDER — PALIPERIDONE ER 9 MG PO TB24: 9.0000 mg | ORAL_TABLET | Freq: Every day | ORAL | 0 refills | Status: AC

## 2023-01-28 MED ORDER — PALIPERIDONE ER 9 MG PO TB24: 9.0000 mg | ORAL_TABLET | Freq: Every day | ORAL | 0 refills | Status: DC

## 2023-01-28 MED ORDER — TRAZODONE HCL 100 MG PO TABS: 100.0000 mg | ORAL_TABLET | Freq: Every evening | ORAL | 0 refills | Status: AC | PRN

## 2023-01-28 MED ORDER — HYDROXYZINE HCL 25 MG PO TABS: 25.0000 mg | ORAL_TABLET | Freq: Three times a day (TID) | ORAL | 0 refills | Status: AC | PRN

## 2023-01-28 MED ORDER — PALIPERIDONE ER 3 MG PO TB24
9.0000 mg | ORAL_TABLET | Freq: Every day | ORAL | Status: DC
  Administered 2023-01-28: 9 mg via ORAL
  Filled 2023-01-28 (×3): qty 3

## 2023-01-28 MED ORDER — PALIPERIDONE PALMITATE ER 156 MG/ML IM SUSY: 156.0000 mg | PREFILLED_SYRINGE | INTRAMUSCULAR | 0 refills | Status: AC

## 2023-01-28 NOTE — Discharge Summary (Signed)
Physician Discharge Summary Note  Patient:  Megan Monroe is an 27 y.o., female MRN:  [id number] DOB:  03/03/1996 Patient phone:  [phone] (home)  Patient address:   [place] [id number],  Total Time spent with patient: 45 minutes  Date of Admission:  01/13/2023 Date of Discharge: 01/28/2023  Reason for Admission:  Megan Monroe is a 27 y.o., female with a past psychiatric history significant for schizophrenia, polysubstance use who presents to the [facility] from urgent care for evaluation and management of increased psychosis.  According to outside records, the patient presented to urgent care with police under IVC.  Collateral information obtained from patient's grandmother patient has diagnosis of schizophrenia noncompliant with her medications made suicidal statement to her grandmother that she want to die.  Initial assessment on 01/14/2023, patient was evaluated on the inpatient unit, the patient presents disorganized responding to stimuli unable to answer questions in linear meaningful reliable manner.  Most history information noted below were obtained from chart review for collateral information obtained from grandmother. Patient answers most of the questions "I guess so" "I have no idea" Patient unable to answer simple social history questions for example when asking if she has children she responds "whole lot" when asking how many she responds "I do not know" also she tells me that she does not use any illicit drugs but when confronted with results she responds "I do not" she primarily denies auditory or visual hallucination but with further questioning she does admit to hearing voices of God and seeing God but unable to provide any further details she does admit to feeling paranoid from others but when asked to elaborate more details she responds "I do not know I have no idea" when asking if she has special powers she responds "yes" but  unable to provide details.   Chart review: Patient with diagnosis of schizophrenia was admitted to [facility] in 2021 and a partner from March to June 2023, history of being on [place] injection few months ago but questionable response.  Notes from urgent care were reviewed indicating grandmother and family noted concerns of patient is involved in human trafficking given that she disappears for long peers of time with multiple missing person reports may in the past. Principal Problem: Schizophrenia Discharge Diagnoses: Principal Problem:   Schizophrenia Active Problems:   Stimulant use disorder   Cannabis use disorder, mild, abuse   Past Psychiatric History:  Prior Psychiatric diagnoses: Schizophrenia Past Psychiatric Hospitalizations: [facility] 2021 and [facility] 2023   History of self mutilation: None noted Past suicide attempts: None noted, patient denies Past history of HI, violent or aggressive behavior: None noted   Past Psychiatric medications trials: Kirt Boys as noted above but noncompliant History of ECT/TMS: None noted   Outpatient psychiatric Follow up: Noncompliant Prior Outpatient Therapy: Unknown  Past Medical History:  Past Medical History:  Diagnosis Date   Exercise-induced asthma    No past surgical history on file.  Family History:  Family History  Problem Relation Age of Onset   Hypertension Father    Diabetes Maternal Grandmother    Heart attack Cousin        Early 4s   Family Psychiatric  History:  Psychiatric illness: Unknown Suicide: Unknown Substance Abuse: Unknown Social History:  Social History   Substance and Sexual Activity  Alcohol Use No     Social History   Substance and Sexual Activity  Drug Use No    Social  History   Socioeconomic History   Marital status: Single    Spouse name: Not on file   Number of children: Not on file   Years of education: Not on file   Highest education level: Not on file   Occupational History   Not on file  Tobacco Use   Smoking status: Never   Smokeless tobacco: Never  Vaping Use   Vaping Use: Never used  Substance and Sexual Activity   Alcohol use: No   Drug use: No   Sexual activity: Never  Other Topics Concern   Not on file  Social History Narrative   Pt lives with mother, father (or step father, this information was not clarified to me) and grandmother.   Social Determinants of Health   Financial Resource Strain: Not on file  Food Insecurity: Not on file  Transportation Needs: Not on file  Physical Activity: Not on file  Stress: Not on file  Social Connections: Not on file   Living situation: Unknown, per chart review patient is homeless Social support: Limited Marital Status: Patient reports she was married multiple times but unreliable Children: "Whole lot" unreliable historian Education: Unknown Employment: Adult nurse service: Unknown Legal history: Patient denies but unreliable Trauma: Unknown Access to guns: Unknown  Substance Use History:  Alcohol: Denies Tobacoo: Denies Marijuana: Denies but tested positive Cocaine: Denies but tested positive Stimulants: Denies but tested positive IV drug use: Denies Opiates: Denies Prescribed Meds abuse: Denies H/O withdrawals, blackouts, DTs: Denies History of Detox / Rehab: Denies DUI: Denies  Hospital Course:   During the patient's hospitalization, patient had extensive initial psychiatric evaluation, and follow-up psychiatric evaluations every day.   Psychiatric diagnoses provided upon initial assessment: Schizophrenia [facility])    Patient's psychiatric medications were adjusted on admission: Patient was started on Invega orally 3 mg daily to address psychosis and mood   During the hospitalization, other adjustments were made to the patient's psychiatric medication regimen: Invega was titrated up gradually to 9 mg daily, it was noted to be helpful for psychosis and mood so  patient was started on [place] injection, primarily received 234 mg IM then on 4/2 she received Invega Sustenna 156 mg IM to be given every 28 days afterward.  Oral Lorayne Bender was discontinued primarily on 4/1 but patient was noted to be with increased irritability and some increased bizarre behavior so on 4/3 I restarted oral Invega to be continuing after discharge until next injection due on 4/30.  During hospital stay patient also received Vistaril as needed for anxiety average once daily with good efficacy, also received trazodone 100 mg at bedtime as needed for sleep almost nightly with good efficacy reported with no side effects.   Patient's care was discussed during the interdisciplinary team meeting every day during the hospitalization.   The patient denied having side effects to prescribed psychiatric medication.   Gradually, patient started adjusting to milieu. The patient was evaluated each day by a clinical provider to ascertain response to treatment. Improvement was noted by the patient's report of decreasing symptoms, improved sleep and appetite, affect, medication tolerance, behavior, and participation in unit programming.  Patient was asked each day to complete a self inventory noting mood, mental status, pain, new symptoms, anxiety and concerns.     Symptoms were reported as significantly decreased or resolved completely by discharge.    On day of discharge, patient was evaluated on 01/28/2023 she did report having a good day for the past few days, reported good sleep  and appetite, did continue to deny SI HI or AVH and did not present responding to stimuli or laughing loudly inappropriately as during beginning of this hospitalization.  She did continue to present with some disorganized thoughts and occasionally appearing confused but from discussion with family members this seems to be her baseline.  Patient denies side effect to medications and agrees to comply with medications and  follow-up appointment after discharge.  She did agree with the plan that her cousin will be picking her up today and she will be staying with her cousin after discharge in the area.  She denies feeling depressed or anxious and denies feeling hopeless helpless or worthless.  Secondary to some baseline cognitive limitations she is unable to discuss crisis plan but I reiterated to her and also it was discussed with her family that in case of worsening psychosis or mood instability she can go to nearest emergency room or urgent care or call 911.  I also discussed with patient today recommendation to abstain completely from illicit drug use after discharge, she agrees.    The patient reports their target psychiatric symptoms of psychosis and hallucinations responded well to the psychiatric medications, and the patient reports overall benefit other psychiatric hospitalization. Supportive psychotherapy was provided to the patient. The patient also participated in regular group therapy while hospitalized. Coping skills, problem solving as well as relaxation therapies were also part of the unit programming.   Labs were reviewed with the patient, and abnormal results were discussed with the patient.   The patient is able to verbalize their individual safety plan to this provider.   Behavioral Events: Some episodes of irritability requiring verbal direction and occasionally as needed Vistaril with good efficacy.   Restraints: None   Groups: Attended and was appropriate with limited participation given baseline cognitive impairment   Medications Changes: As above   Sleep  Sleep: Good sleep reported during hospital stay    Physical Findings: AIMS: Facial and Oral Movements Muscles of Facial Expression: None, normal Lips and Perioral Area: None, normal Jaw: None, normal Tongue: None, normal,Extremity Movements Upper (arms, wrists, hands, fingers): None, normal Lower (legs, knees, ankles, toes): None,  normal, Trunk Movements Neck, shoulders, hips: None, normal, Overall Severity Severity of abnormal movements (highest score from questions above): None, normal Incapacitation due to abnormal movements: None, normal Patient's awareness of abnormal movements (rate only patient's report): No Awareness, Dental Status Current problems with teeth and/or dentures?: No Does patient usually wear dentures?: No  CIWA:    COWS:     Musculoskeletal: Strength & Muscle Tone: within normal limits Gait & Station: normal Patient leans: N/A   Psychiatric Specialty Exam:  General Appearance: appears at stated age, casually dressed and groomed, unkempt   Behavior: Calm and cooperative   Psychomotor Activity: No agitation, mild retardation noted   Eye Contact: Limited Speech: Decreased amount     Mood: euthymic Affect: Restricted but pleasant in general   Thought Process: Linear yet concrete occasionally confused, no circumstantiality or tangentiality noted, no flight of ideas or racing thoughts noted.  Occasionally disorganized with some occasional thought blocking noted Descriptions of Associations: Concrete Thought Content: Hallucinations: denies AH, VH , does not appear responding to stimuli inappropriately Delusions: No paranoia or other delusions noted Suicidal Thoughts: denies SI, intention, plan  Homicidal Thoughts: denies HI, intention, plan    Alertness/Orientation: alert and limited orientation, able to name the day of the week as well as a month with some delay but unable to  know the year "I have no idea" oriented to place as behavioral health but confused about city and state.  Limited orientation about situation and why she came to the hospital "I have no idea" however and agrees with discharge plan with her cousin   Insight: Improved yet limited Judgment: Improved yet limited, compliant with medications in the hospital   Memory: Limited   Executive Functions  Concentration:  Limited Attention Span: Limited Recall: Limited Fund of Knowledge: Limited  Assets  Assets: Resilience; Desire for Improvement     Physical Exam:  Physical Exam Vitals and nursing note reviewed.  Constitutional:      Appearance: Normal appearance.  HENT:     Head: Normocephalic and atraumatic.     Nose: Nose normal.  Eyes:     Pupils: Pupils are equal, round, and reactive to light.  Pulmonary:     Effort: Pulmonary effort is normal.  Musculoskeletal:        General: Normal range of motion.     Cervical back: Normal range of motion.  Neurological:     General: No focal deficit present.     Mental Status: She is alert and oriented to person, place, and time.  Psychiatric:        Mood and Affect: Mood normal.        Thought Content: Thought content normal.    Review of Systems  All other systems reviewed and are negative.  Blood pressure 127/86, pulse 95, temperature 98.6 F (37 C), temperature source Oral, resp. rate 15, height 5\' 3"  (1.6 m), weight 50.3 kg, SpO2 100 %. Body mass index is 19.66 kg/m.   Social History   Tobacco Use  Smoking Status Never  Smokeless Tobacco Never   Tobacco Cessation:  N/A, patient does not currently use tobacco products   Blood Alcohol level:  Lab Results  Component Value Date   ETH <10 01/12/2023   ETH <10 99991111    Metabolic Disorder Labs:  Lab Results  Component Value Date   HGBA1C 5.7 (H) 01/12/2023   MPG 117 01/12/2023   Lab Results  Component Value Date   PROLACTIN 5.5 01/12/2023   Lab Results  Component Value Date   CHOL 107 01/12/2023   TRIG 61 01/12/2023   HDL 30 (L) 01/12/2023   CHOLHDL 3.6 01/12/2023   VLDL 12 01/12/2023   LDLCALC 65 01/12/2023   LDLCALC 107 08/13/2012    See Psychiatric Specialty Exam and Suicide Risk Assessment completed by Attending Physician prior to discharge.  Discharge destination:  Home  Is patient on multiple antipsychotic therapies at discharge:  No   Has Patient  had three or more failed trials of antipsychotic monotherapy by history:  No  Recommended Plan for Multiple Antipsychotic Therapies: NA  Discharge Instructions     Diet - low sodium heart healthy   Complete by: As directed    Increase activity slowly   Complete by: As directed       Allergies as of 01/28/2023   No Known Allergies      Medication List     TAKE these medications      Indication  hydrOXYzine 25 MG tablet Commonly known as: ATARAX Take 1 tablet (25 mg total) by mouth 3 (three) times daily as needed for anxiety.  Indication: Feeling Anxious   paliperidone 156 MG/ML Susy injection Commonly known as: INVEGA SUSTENNA Inject 1 mL (156 mg total) into the muscle every 28 (twenty-eight) days. Start taking on: February 24, 2023  Indication: Schizophrenia   paliperidone 9 MG 24 hr tablet Commonly known as: INVEGA Take 1 tablet (9 mg total) by mouth daily.  Indication: Schizophrenia   traZODone 100 MG tablet Commonly known as: DESYREL Take 1 tablet (100 mg total) by mouth at bedtime as needed for sleep.  Indication: [place] Follow up on 02/12/2023.   Specialty: Behavioral Health Why: You have an appointment for therapy services on 02/12/23 at 11:00 am. This appointment will be Virtual.    You also have an appointment for medication management services on 02/17/23 at 10:00 am. This appointment will be held in person. Contact information: [place] [phone]                Discharge recommendations:   Patient will be discharged to family/cousin who would be staying with patient to ensure compliance with outpatient follow-up appointment as well as outpatient medications including monthly injection.  Activity: as tolerated  Diet: heart healthy  # It is recommended to the patient to continue psychiatric medications as prescribed, after  discharge from the hospital.     # It is recommended to the patient to follow up with your outpatient psychiatric provider and PCP.   # It was discussed with the patient, the impact of alcohol, drugs, tobacco have been there overall psychiatric and medical wellbeing, and total abstinence from substance use was recommended the patient.ed.   # Prescriptions provided or sent directly to preferred pharmacy at discharge. Patient agreeable to plan. Given opportunity to ask questions. Appears to feel comfortable with discharge.    # In the event of worsening symptoms, the patient is instructed to call the crisis hotline, 911 and or go to the nearest ED for appropriate evaluation and treatment of symptoms. To follow-up with primary care provider for other medical issues, concerns and or health care needs   # Patient was discharged home with a plan to follow up as noted above.   Patient agrees with D/C instructions and plan.   The patient received suicide prevention pamphlet:  Yes Belongings returned:  Clothing and Valuables  Total Time Spent in Direct Patient Care:  I personally spent 45 minutes on the unit in direct patient care. The direct patient care time included face-to-face time with the patient, reviewing the patient's chart, communicating with other professionals, and coordinating care. Greater than 50% of this time was spent in counseling or coordinating care with the patient regarding goals of hospitalization, psycho-education, and discharge planning needs.    SignedDian Situ, MD 01/28/2023, 9:35 AM

## 2023-01-28 NOTE — Group Note (Signed)
Date:  01/28/2023 Time:  11:13 AM  Group Topic/Focus:  Orientation:   The focus of this group is to educate the patient on the purpose and policies of crisis stabilization and provide a format to answer questions about their admission.  The group details unit policies and expectations of patients while admitted.    Participation Level:  Did Not Attend  Participation Quality:      Affect:      Cognitive:      Insight: None  Engagement in Group:      Modes of Intervention:      Additional Comments:     Jerrye Beavers 01/28/2023, 11:13 AM

## 2023-01-28 NOTE — Progress Notes (Signed)
  [facility] Adult Case Management Discharge Plan :  Will you be returning to the same living situation after discharge:  Yes,  home At discharge, do you have transportation home?: Yes,  Cousin, Charity, and family friend Do you have the ability to pay for your medications: Yes,  insurance   Release of information consent forms completed and in the chart;  Patient's signature needed at discharge.  Patient to Follow up at:  [facility] Follow up on 02/12/2023.   Specialty: Behavioral Health Why: You have an appointment for therapy services on 02/12/23 at 11:00 am. This appointment will be Virtual.    You also have an appointment for medication management services on 02/17/23 at 10:00 am. This appointment will be held in person. Contact information: [place] [phone]                Next level of care provider has access to [place] and Suicide Prevention discussed: Yes,  Grandmother     Has patient been referred to the Quitline?: N/A patient is not a smoker  Patient has been referred for addiction treatment: [place], LCSW 01/28/2023, 9:07 AM

## 2023-01-28 NOTE — Progress Notes (Signed)
   01/28/23 0556  15 Minute Checks  Location Bedroom  Visual Appearance Calm  Behavior Sleeping  Sleep (Behavioral Health Patients Only)  Calculate sleep? (Click Yes once per 24 hr at 0600 safety check) Yes  Documented sleep last 24 hours 9

## 2023-01-28 NOTE — Progress Notes (Signed)
Recreation Therapy Notes  INPATIENT RECREATION TR PLAN  Patient Details Name: Kylii Delafuente MRN: [id number] DOB: 1996/07/31 Today's Date: 01/28/2023  Rec Therapy Plan Is patient appropriate for Therapeutic Recreation?: Yes Treatment times per week: about 3 days Estimated Length of Stay: 5-7 days TR Treatment/Interventions: Group participation (Comment)  Discharge Criteria Pt will be discharged from therapy if:: Discharged Treatment plan/goals/alternatives discussed and agreed upon by:: Patient/family  Discharge Summary Short term goals set: See patient care plan Short term goals met: Adequate for discharge Progress toward goals comments: Groups attended Which groups?: Self-esteem, Coping skills, Wellness, Goal setting, Communication, Other (Comment) (Triggers; Decision Making) Reason goals not met: Pt wasn't able to engage in groups without random laughing and zoning out. Therapeutic equipment acquired: N/A Reason patient discharged from therapy: Discharge from hospital Pt/family agrees with progress & goals achieved: Yes Date patient discharged from therapy: 01/28/23   Aliegha Paullin-McCall, LRT,CTRS Yulianna Folse A Romuald Mccaslin-McCall 01/28/2023, 12:57 PM

## 2023-01-28 NOTE — Group Note (Signed)
Date:  01/28/2023 Time:  11:15 AM  Group Topic/Focus:  Goals Group:   The focus of this group is to help patients establish daily goals to achieve during treatment and discuss how the patient can incorporate goal setting into their daily lives to aide in recovery.    Participation Level:    Participation Quality:  Attentive  Affect:  Lethargic  Cognitive:  Lacking  Insight: Appropriate  Engagement in Group:  Limited  Modes of Intervention:  Discussion  Additional Comments:     Jerrye Beavers 01/28/2023, 11:15 AM

## 2023-01-28 NOTE — Progress Notes (Addendum)
   01/27/23 2041  Psych Admission Type (Psych Patients Only)  Admission Status Involuntary  Psychosocial Assessment  Patient Complaints None  Eye Contact Fair  Facial Expression Animated;Fixed smile  Affect Anxious;Apprehensive  Speech Soft;Logical/coherent  Interaction Minimal  Motor Activity Fidgety;Restless  Appearance/Hygiene Improved  Behavior Characteristics Cooperative;Restless  Mood Preoccupied;Suspicious  Thought Process  Coherency Circumstantial  Content Preoccupation  Delusions None reported or observed  Perception Hallucinations  Hallucination Auditory  Judgment Impaired  Confusion Mild  Danger to Self  Current suicidal ideation? Denies  Danger to Others  Danger to Others None reported or observed   Patient presents with no complaints.  Administered PRN Trazodone per [facility] per patient request.  Patient is safe on the unit with q15 minute safety checks.

## 2023-01-28 NOTE — [facility] Suicide Risk Assessment (Signed)
[facility] Discharge Suicide Risk Assessment   Principal Problem: Schizophrenia Discharge Diagnoses: Principal Problem:   Schizophrenia Active Problems:   Stimulant use disorder   Cannabis use disorder, mild, abuse   Total Time spent with patient: 45 minutes  Reason for admission: Megan Monroe is a 27 y.o., female with a past psychiatric history significant for schizophrenia, polysubstance use who presents to the [facility] from urgent care for evaluation and management of increased psychosis.  According to outside records, the patient presented to urgent care with police under IVC.  Collateral information obtained from patient's grandmother patient has diagnosis of schizophrenia noncompliant with her medications made suicidal statement to her grandmother that she want to die.   Initial assessment on 01/14/2023, patient was evaluated on the inpatient unit, the patient presents disorganized responding to stimuli unable to answer questions in linear meaningful reliable manner.  Most history information noted below were obtained from chart review for collateral information obtained from grandmother. Patient answers most of the questions "I guess so" "I have no idea" Patient unable to answer simple social history questions for example when asking if she has children she responds "whole lot" when asking how many she responds "I do not know" also she tells me that she does not use any illicit drugs but when confronted with results she responds "I do not" she primarily denies auditory or visual hallucination but with further questioning she does admit to hearing voices of God and seeing God but unable to provide any further details she does admit to feeling paranoid from others but when asked to elaborate more details she responds "I do not know I have no idea" when asking if she has special powers she responds "yes" but unable to provide details.   Chart review: Patient with diagnosis of  schizophrenia was admitted to [facility] [place] in 2021 and a partner from March to June 2023, history of being on [place] injection few months ago but questionable response.  Notes from urgent care were reviewed indicating grandmother and family noted concerns of patient is involved in human trafficking given that she disappears for long peers of time with multiple missing person reports may in the past.  PTA Medications:  No medications prior to admission.   Hospital Course:   During the patient's hospitalization, patient had extensive initial psychiatric evaluation, and follow-up psychiatric evaluations every day.  Psychiatric diagnoses provided upon initial assessment: Schizophrenia [facility])   Patient's psychiatric medications were adjusted on admission: Patient was started on Invega orally 3 mg daily to address psychosis and mood  During the hospitalization, other adjustments were made to the patient's psychiatric medication regimen: Invega was titrated up gradually to 9 mg daily, it was noted to be helpful for psychosis and mood so patient was started on [place] injection, primarily received 234 mg IM then on 4/2 she received Invega Sustenna 156 mg IM to be given every 28 days afterward.  Oral Lorayne Bender was discontinued primarily on 4/1 but patient was noted to be with increased irritability and some increased bizarre behavior so on 4/3 I restarted oral Invega to be continuing after discharge until next injection due on 4/30.  During hospital stay patient also received Vistaril as needed for anxiety average once daily with good efficacy, also received trazodone 100 mg at bedtime as needed for sleep almost nightly with good efficacy reported with no side effects.  Patient's care was discussed during the interdisciplinary team meeting every day during the hospitalization.  The patient denied  having side effects to prescribed psychiatric medication.  Gradually, patient started adjusting to  milieu. The patient was evaluated each day by a clinical provider to ascertain response to treatment. Improvement was noted by the patient's report of decreasing symptoms, improved sleep and appetite, affect, medication tolerance, behavior, and participation in unit programming.  Patient was asked each day to complete a self inventory noting mood, mental status, pain, new symptoms, anxiety and concerns.    Symptoms were reported as significantly decreased or resolved completely by discharge.   On day of discharge, patient was evaluated on 01/28/2023 she did report having a good day for the past few days, reported good sleep and appetite, did continue to deny SI HI or AVH and did not present responding to stimuli or laughing loudly inappropriately as during beginning of this hospitalization.  She did continue to present with some disorganized thoughts and occasionally appearing confused but from discussion with family members this seems to be her baseline.  Patient denies side effect to medications and agrees to comply with medications and follow-up appointment after discharge.  She did agree with the plan that her cousin will be picking her up today and she will be staying with her cousin after discharge in the area.  She denies feeling depressed or anxious and denies feeling hopeless helpless or worthless.  Secondary to some baseline cognitive limitations she is unable to discuss crisis plan but I reiterated to her and also it was discussed with her family that in case of worsening psychosis or mood instability she can go to nearest emergency room or urgent care or call 911.  I also discussed with patient today recommendation to abstain completely from illicit drug use after discharge, she agrees.   The patient reports their target psychiatric symptoms of psychosis and hallucinations responded well to the psychiatric medications, and the patient reports overall benefit other psychiatric hospitalization.  Supportive psychotherapy was provided to the patient. The patient also participated in regular group therapy while hospitalized. Coping skills, problem solving as well as relaxation therapies were also part of the unit programming.  Labs were reviewed with the patient, and abnormal results were discussed with the patient.  The patient is able to verbalize their individual safety plan to this provider.  Behavioral Events: Some episodes of irritability requiring verbal direction and occasionally as needed Vistaril with good efficacy.  Restraints: None  Groups: Attended and was appropriate with limited participation given baseline cognitive impairment  Medications Changes: As above  Sleep  Sleep: Good sleep reported during hospital stay  Musculoskeletal: Strength & Muscle Tone: within normal limits Gait & Station: normal Patient leans: N/A  Psychiatric Specialty Exam  General Appearance: appears at stated age, casually dressed and groomed, unkempt  Behavior: Calm and cooperative  Psychomotor Activity: No agitation, mild retardation noted  Eye Contact: Limited Speech: Decreased amount   Mood: euthymic Affect: Restricted but pleasant in general  Thought Process: Linear yet concrete occasionally confused, no circumstantiality or tangentiality noted, no flight of ideas or racing thoughts noted.  Occasionally disorganized with some occasional thought blocking noted Descriptions of Associations: Concrete Thought Content: Hallucinations: denies AH, VH , does not appear responding to stimuli inappropriately Delusions: No paranoia or other delusions noted Suicidal Thoughts: denies SI, intention, plan  Homicidal Thoughts: denies HI, intention, plan   Alertness/Orientation: alert and limited orientation, able to name the day of the week as well as a month with some delay but unable to know the year "I have no idea" oriented  to place as behavioral health but confused about city and  state.  Limited orientation about situation and why she came to the hospital "I have no idea" however and agrees with discharge plan with her cousin  Insight: Improved yet limited Judgment: Improved yet limited, compliant with medications in the hospital  Memory: Limited  Executive Functions  Concentration: Limited Attention Span: Limited Recall: Limited Fund of Knowledge: Limited  Assets  Assets: Resilience; Desire for Improvement   Physical Exam: Physical Exam ROS Blood pressure 127/86, pulse 95, temperature 98.6 F (37 C), temperature source Oral, resp. rate 15, height 5\' 3"  (1.6 m), weight 50.3 kg, SpO2 100 %. Body mass index is 19.66 kg/m.  Mental Status Per Nursing Assessment::   On Admission:     Demographic Factors:  Living alone and Unemployed  Loss Factors: NA  Historical Factors: NA  Risk Reduction Factors:   Positive social support  Continued Clinical Symptoms:  Alcohol/Substance Abuse/Dependencies Schizophrenia:   Paranoid or undifferentiated type  Cognitive Features That Contribute To Risk:  None    Suicide Risk:  Minimal: No identifiable suicidal ideation.  Patients presenting with no risk factors but with morbid ruminations; may be classified as minimal risk based on the severity of the depressive symptoms   Follow-up [facility] Follow up on 02/12/2023.   Specialty: Behavioral Health Why: You have an appointment for therapy services on 02/12/23 at 11:00 am. This appointment will be Virtual.    You also have an appointment for medication management services on 02/17/23 at 10:00 am. This appointment will be held in person. Contact information: [place] [phone]                Plan Of Care/Follow-up recommendations:    Discharge recommendations:    Patient will be discharged to family/cousin who would be staying with patient to ensure compliance with  outpatient follow-up appointment as well as outpatient medications including monthly injection.  Activity: as tolerated  Diet: heart healthy  # It is recommended to the patient to continue psychiatric medications as prescribed, after discharge from the hospital.     # It is recommended to the patient to follow up with your outpatient psychiatric provider and PCP.   # It was discussed with the patient, the impact of alcohol, drugs, tobacco have been there overall psychiatric and medical wellbeing, and total abstinence from substance use was recommended the patient.ed.   # Prescriptions provided or sent directly to preferred pharmacy at discharge. Patient agreeable to plan. Given opportunity to ask questions. Appears to feel comfortable with discharge.    # In the event of worsening symptoms, the patient is instructed to call the crisis hotline, 911 and or go to the nearest ED for appropriate evaluation and treatment of symptoms. To follow-up with primary care provider for other medical issues, concerns and or health care needs   # Patient was discharged home with a plan to follow up as noted above.   Patient agrees with D/C instructions and plan.  The patient received suicide prevention pamphlet:  Yes Belongings returned:  Clothing and Valuables  Total Time Spent in Direct Patient Care:  I personally spent 45 minutes on the unit in direct patient care. The direct patient care time included face-to-face time with the patient, reviewing the patient's chart, communicating with other professionals, and coordinating care. Greater than 50% of this time was spent in counseling or coordinating care with  the patient regarding goals of hospitalization, psycho-education, and discharge planning needs.   Braelyn Bordonaro 01/28/2023, 9:19 AM   Ambrie Carte Winfred Leeds, MD 01/28/2023, 9:19 AM

## 2023-01-28 NOTE — Group Note (Signed)
Recreation Therapy Group Note   Group Topic:Communication  Group Date: 01/28/2023 Start Time: [id number] End Time: 1025 Facilitators: Kharisma Glasner-McCall, LRT,CTRS Location: [address] Dayroom   Goal Area(s) Addresses:  Patient will effectively listen to complete activity.  Patient will identify communication skills used to make activity successful.  Patient will identify how skills used during activity can be used to reach post d/c goals.    Group Description: Geometric Drawings.  Three volunteers from the peer group will be shown an abstract picture with a particular arrangement of geometrical shapes.  Each round, one 'speaker' will describe the pattern, as accurately as possible without revealing the image to the group.  The remaining group members will listen and draw the picture to reflect how it is described to them. Patients with the role of 'listener' cannot ask clarifying questions but, may request that the speaker repeat a direction. Once the drawings are complete, the presenter will show the rest of the group the picture and compare how close each person came to drawing the picture. LRT will facilitate a post-activity discussion regarding effective communication and the importance of planning, listening, and asking for clarification in daily interactions with others.   Affect/Mood: Labile   Participation Level: Minimal   Participation Quality: Minimal Cues   Behavior: Disinterested and Preoccupied   Speech/Thought Process: Relevant   Insight: Lacking   Judgement: Lacking    Modes of Intervention: Activity   Patient Response to Interventions:  Disengaged   Education Outcome:  Acknowledges education and In group clarification offered    Clinical Observations/Individualized Feedback: Pt appeared to be in her own world.  Pt would have some random laughing throughout group.  Pt would also walk around the room.  Pt did state she couldn't concentrate".    Plan: Continue to  engage patient in RT group sessions 2-3x/week.   Cheryl Chay-McCall, LRT,CTRS 01/28/2023 12:04 PM

## 2023-01-28 NOTE — Plan of Care (Signed)
  Problem: Nutrition: Goal: Adequate nutrition will be maintained Outcome: Progressing   Problem: Coping: Goal: Level of anxiety will decrease Outcome: Progressing   

## 2023-01-28 NOTE — Progress Notes (Addendum)
D: Pt A & O X 3. Denies SI, HI, AVH and pain at this time. D/C home as ordered. Picked up in lobby by her cousin.  A: D/C instructions reviewed with pt including prescriptions and follow up appointment, compliance encouraged. All belongings from locker 8 returned to pt at time of departure. Scheduled  medications given with verbal education and effects monitored. Safety checks maintained without incident till time of d/c.  R: Pt receptive to care. Compliant with medications when offered. Denies adverse drug reactions when assessed. Verbalized understanding related to d/c instructions. Signed belonging sheet in agreement with items received from locker. Ambulatory with a steady gait. Appears to be in no physical distress at time of departure.

## 2023-01-28 NOTE — Plan of Care (Signed)
Patient was engaging in group with a calm and appropriate mood but was still unable to focus and fully engage during recreation therapy group sessions.   Keshawna Dix-McCall, LRT,CTRS

## 2023-02-12 ENCOUNTER — Ambulatory Visit (INDEPENDENT_AMBULATORY_CARE_PROVIDER_SITE_OTHER): Payer: Medicaid Other | Admitting: Clinical

## 2023-02-12 DIAGNOSIS — F209 Schizophrenia, unspecified: Secondary | ICD-10-CM

## 2023-02-12 NOTE — Progress Notes (Signed)
   THERAPIST PROGRESS NOTE Virtual Visit via Video Note  I connected with Chryl Heck on 02/12/2023 at 11:00 AM EDT by a video enabled telemedicine application and verified that I am speaking with the correct person using two identifiers.  Location: Patient: family members home Provider: office   I discussed the limitations of evaluation and management by telemedicine and the availability of in person appointments. The patient expressed understanding and agreed to proceed.   Follow Up Instructions: I discussed the assessment and treatment plan with the patient. The patient was provided an opportunity to ask questions and all were answered. The patient agreed with the plan and demonstrated an understanding of the instructions.   The patient was advised to call back or seek an in-person evaluation if the symptoms worsen or if the condition fails to improve as anticipated.    Session Time: 30 minutes  Participation Level: Active  Behavioral Response: CasualAlertEuthymic  Type of Therapy: Individual Therapy  Treatment Goals addressed: client will attend at least 80% of follow up medication management appointments  ProgressTowards Goals: Initial  Interventions: CBT  Summary:  Tynisha Ogan is a 27 y.o. female who presents to the [facility] health center for outpatient services. A female, who did not participate in the appointment, helped connect for the video appointment on today and was identified as a cousin by the client. Client is presenting following discharge from the [facility] behavioral hospital. Per the clients chart she was sent to [facility] via IVC on 01/13/2023. Client asked the therapist what today's appointment was for and what "outpatient" entails for services. Client was informed by the therapist about the purpose of today's appointment and what to expect by attending her appointments at this center. Client presented with a labile mood and lack  of insight. Client was unable to give the therapist background information on why she was taken into inpatient treatment recently. Client reported they were trying to figure out her medication regimen without other detail. Client reported her current medication helps her to sleep and eat well. Client today she is currently in [place] visiting family.  Evidence of progress towards goal:  client reported medication compliance 7 days per week.   Suicidal/Homicidal: Nowithout intent/plan  Therapist Response:  Therapist began the appointment making introductions and discussing confidentiality. Therapist engaged using active listening and positive emotional support. Therapist used CBT to ask the client open ended questions about her mental health history. Therapist used CBT to ask the client about medication compliance and any side effect. Therapist used CBT ask the client to identify her progress with frequency of use with coping skills with continued practice in her daily activity.    Therapist assisted the client with follow up appointments.   Plan: Client will return to be seen by a psychiatrist for medication management.  Diagnosis: schizophrenia, unspecified  Collaboration of Care: Other therapist emailed admin staff to schedule the client for a psychiatry appointment.  Patient/Guardian was advised Release of Information must be obtained prior to any record release in order to collaborate their care with an outside provider. Patient/Guardian was advised if they have not already done so to contact the registration department to sign all necessary forms in order for [place] to release information regarding their care.   Consent: Patient/Guardian gives verbal consent for treatment and assignment of benefits for services provided during this visit. Patient/Guardian expressed understanding and agreed to proceed.   Neena Rhymes Michalla Ringer, LCSW 02/12/2023

## 2023-02-16 NOTE — Progress Notes (Deleted)
Psychiatric Initial Adult Assessment  Patient Identification: Megan Monroe MRN:  [id number] Date of Evaluation:  02/16/2023 Referral Source: ***psychiatric discharge  Assessment:  Megan Monroe is a 27 y.o. 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  Patient reports ***  Plan:  # Substance induced psychosis vs. schizophrenia Past medication trials:  Status of problem: *** Interventions: -- *** -- S/p Invega Sustenna 234 mg loading dose 01/22/23, Invega Sustenna 156 mg 01/27/23 -- Invega PO 9 mg daily -- Trazodone 100 mg nightly PRN sleep -- Atarax 25 mg TID PRN anxiety  # *** Past medication trials:  Status of problem: *** Interventions: -- ***  # Metabolic monitoring Past medication trials:  Status of problem: *** Interventions: -- *** -- HgbA1c 01/12/23 5.7 (prediabetes) -- Lipid panel 01/12/23 revealing for low HDL  Patient was given contact information for behavioral health clinic and was instructed to call 911 for emergencies.   Subjective:  Chief Complaint: No chief complaint on file.   History of Present Illness:  ***  Chart review: -- Admitted to [facility] from 01/13/23-01/28/23 for psychosis (disorganization, [facility]) and suicidal statement made to grandmother. Labs notable for: RPR reactive (titer 1:2) and T pallidum Ab reactive, positive N gonorrhea, UDS positive for amphetamine and cocaine, BAL negative Diagnoses noted include schizophrenia, stimulant use disorder, and cannabis use disorder mild. Started on Invega PO and discharged on Invega Sustenna 156 mg IM monthly (last received ***) with plan to continue PO 9 mg until time of next injection due to irritability when PO was weaned. Discharged on Atarax 25 mg TID PRN anxiety and Trazodone 100 mg nightly PRN sleep. Discharged with  cousin. -- Seen for CCA by Deloris Ping, LCSW on 02/12/23   PCP?  Hx syphilis? Syphilis, Microcytic anemia R/o neurosyphilis: HA, N/V, visual disturbance, stiff neck, tremor, confusion, N/T, pupils    Past Psychiatric History:  Diagnoses: ***schizophrenia, stimulant use disorder, cannabis use disorder mild Medication trials: ***Gean Birchwood Previous psychiatrist/therapist: *** Hospitalizations: ***[facility] March 2024 for psychosis/SI, [facility] 2023, [facility] 2021 Suicide attempts: *** SIB: *** Hx of violence towards others: *** Current access to guns: *** Hx of abuse: ***  Substance Abuse History in the last 12 months:  {yes no:314532}  Past Medical History:  Past Medical History:  Diagnosis Date   Exercise-induced asthma    No past surgical history on file.  Family Psychiatric History: ***  Family History:  Family History  Problem Relation Age of Onset   Hypertension Father    Diabetes Maternal Grandmother    Heart attack Cousin        Early 30s    Social History:   Social History   Socioeconomic History   Marital status: Single    Spouse name: Not on file   Number of children: Not on file   Years of education: Not on file   Highest education level: Not on file  Occupational History   Not on file  Tobacco Use   Smoking status: Never   Smokeless tobacco: Never  Vaping Use   Vaping Use: Never used  Substance and Sexual Activity   Alcohol use: No   Drug use: No   Sexual activity: Never  Other Topics Concern   Not on file  Social History Narrative   Pt lives with mother, father (or step father, this information was not clarified  to me) and grandmother.   Social Determinants of Health   Financial Resource Strain: Not on file  Food Insecurity: Not on file  Transportation Needs: Not on file  Physical Activity: Not on file  Stress: Not on file  Social Connections: Not on file    Additional Social History: ***  Allergies:  No Known  Allergies  Current Medications: Current Outpatient Medications  Medication Sig Dispense Refill   hydrOXYzine (ATARAX) 25 MG tablet Take 1 tablet (25 mg total) by mouth 3 (three) times daily as needed for anxiety. 30 tablet 0   [START ON 02/24/2023] paliperidone (INVEGA SUSTENNA) 156 MG/ML SUSY injection Inject 1 mL (156 mg total) into the muscle every 28 (twenty-eight) days. 1 mL 0   paliperidone (INVEGA) 9 MG 24 hr tablet Take 1 tablet (9 mg total) by mouth daily. 28 tablet 0   traZODone (DESYREL) 100 MG tablet Take 1 tablet (100 mg total) by mouth at bedtime as needed for sleep. 30 tablet 0   No current facility-administered medications for this visit.    ROS: Review of Systems  Objective:  Psychiatric Specialty Exam: There were no vitals taken for this visit.There is no height or weight on file to calculate BMI.  General Appearance: {Appearance:22683}  Eye Contact:  {BHH EYE CONTACT:22684}  Speech:  {Speech:22685}  Volume:  {Volume (PAA):22686}  Mood:  {BHH MOOD:22306}  Affect:  {Affect (PAA):22687}  Thought Content: {Thought Content:22690}   Suicidal Thoughts:  {ST/HT (PAA):22692}  Homicidal Thoughts:  {ST/HT (PAA):22692}  Thought Process:  {Thought Process (PAA):22688}  Orientation:  {BHH ORIENTATION (PAA):22689}    Memory:  {BHH MEMORY:22881}  Judgment:  {Judgement (PAA):22694}  Insight:  {Insight (PAA):22695}  Concentration:  {Concentration:21399}  Recall:  {BHH GOOD/FAIR/POOR:22877}  Fund of Knowledge: {BHH GOOD/FAIR/POOR:22877}  Language: {BHH GOOD/FAIR/POOR:22877}  Psychomotor Activity:  {Psychomotor (PAA):22696}  Akathisia:  {BHH YES OR NO:22294}  AIMS (if indicated): {Desc; done/not:10129}  Assets:  {Assets (PAA):22698}  ADL's:  {BHH ONG'E:95284}  Cognition: {chl bhh cognition:304700322}  Sleep:  {BHH GOOD/FAIR/POOR:22877}   PE: General: well-appearing; no acute distress *** Pulm: no increased work of breathing on room air *** Strength & Muscle Tone:  {desc; muscle tone:32375} Neuro: no focal neurological deficits observed *** Gait & Station: {PE GAIT ED XLKG:40102}  Metabolic Disorder Labs: Lab Results  Component Value Date   HGBA1C 5.7 (H) 01/12/2023   MPG 117 01/12/2023   Lab Results  Component Value Date   PROLACTIN 5.5 01/12/2023   Lab Results  Component Value Date   CHOL 107 01/12/2023   TRIG 61 01/12/2023   HDL 30 (L) 01/12/2023   CHOLHDL 3.6 01/12/2023   VLDL 12 01/12/2023   LDLCALC 65 01/12/2023   LDLCALC 107 08/13/2012   Lab Results  Component Value Date   TSH 0.746 01/12/2023    Therapeutic Level Labs: No results found for: "LITHIUM" No results found for: "CBMZ" No results found for: "VALPROATE"  Screenings:  AIMS    Flowsheet Row [facility]  AIMS Total Score 0      Flowsheet Row ED from 01/12/2023 in [facility] ED from 01/08/2020 in [facility] at [facility] ED from 10/27/2019 in [facility] Department at [facility]  C-SSRS RISK CATEGORY No Risk No Risk No Risk       Collaboration of Care: Collaboration of Care: [facility] OP Collaboration of VOZD:[id number]}  Patient/Guardian was advised Release of Information must  be obtained prior to any record release in order to collaborate their care with an outside provider. Patient/Guardian was advised if they have not already done so to contact the registration department to sign all necessary forms in order for [place] to release information regarding their care.   Consent: Patient/Guardian gives verbal consent for treatment and assignment of benefits for services provided during this visit. Patient/Guardian expressed understanding and agreed to proceed.   A total of *** minutes was spent involved in face to face clinical care, chart review, documentation, and ***.   Corrissa Martello A  4/22/20249:24 AM

## 2023-02-17 ENCOUNTER — Ambulatory Visit (HOSPITAL_COMMUNITY): Payer: Medicaid Other | Admitting: Psychiatry
